# Patient Record
Sex: Male | Born: 1965
Health system: Southern US, Community
[De-identification: ages and names within clinical notes are randomized; demographics above are authoritative.]

## PROBLEM LIST (undated history)

## (undated) DIAGNOSIS — I10 Essential (primary) hypertension: Secondary | ICD-10-CM

## (undated) DIAGNOSIS — I639 Cerebral infarction, unspecified: Secondary | ICD-10-CM

## (undated) DIAGNOSIS — E7800 Pure hypercholesterolemia, unspecified: Secondary | ICD-10-CM

## (undated) HISTORY — PX: NO PAST SURGERIES: SHX2092

## (undated) HISTORY — DX: Pure hypercholesterolemia, unspecified: E78.00

---

## 2008-12-21 ENCOUNTER — Encounter: Admission: RE | Admit: 2008-12-21 | Discharge: 2008-12-21 | Payer: Self-pay | Admitting: Orthopedic Surgery

## 2008-12-26 ENCOUNTER — Encounter: Admission: RE | Admit: 2008-12-26 | Discharge: 2008-12-26 | Payer: Self-pay | Admitting: Surgery

## 2008-12-31 ENCOUNTER — Encounter: Admission: RE | Admit: 2008-12-31 | Discharge: 2008-12-31 | Payer: Self-pay | Admitting: Surgery

## 2009-01-07 ENCOUNTER — Encounter: Admission: RE | Admit: 2009-01-07 | Discharge: 2009-01-07 | Payer: Self-pay | Admitting: Surgery

## 2010-01-20 ENCOUNTER — Encounter: Admission: RE | Admit: 2010-01-20 | Discharge: 2010-01-20 | Payer: Self-pay | Admitting: Orthopedic Surgery

## 2010-05-10 ENCOUNTER — Encounter: Payer: Self-pay | Admitting: Orthopedic Surgery

## 2010-05-11 ENCOUNTER — Encounter: Payer: Self-pay | Admitting: Orthopedic Surgery

## 2011-05-02 ENCOUNTER — Other Ambulatory Visit: Payer: Self-pay

## 2011-05-02 ENCOUNTER — Encounter (HOSPITAL_COMMUNITY): Payer: Self-pay

## 2011-05-02 ENCOUNTER — Emergency Department (HOSPITAL_COMMUNITY): Payer: 59

## 2011-05-02 ENCOUNTER — Inpatient Hospital Stay (HOSPITAL_COMMUNITY)
Admission: EM | Admit: 2011-05-02 | Discharge: 2011-05-04 | DRG: 065 | Disposition: A | Discharge status: Home / Self Care | Payer: 59 | Attending: Internal Medicine | Admitting: Internal Medicine

## 2011-05-02 DIAGNOSIS — E785 Hyperlipidemia, unspecified: Secondary | ICD-10-CM | POA: Diagnosis present

## 2011-05-02 DIAGNOSIS — E119 Type 2 diabetes mellitus without complications: Secondary | ICD-10-CM | POA: Diagnosis present

## 2011-05-02 DIAGNOSIS — R29810 Facial weakness: Secondary | ICD-10-CM | POA: Diagnosis present

## 2011-05-02 DIAGNOSIS — I639 Cerebral infarction, unspecified: Secondary | ICD-10-CM | POA: Diagnosis present

## 2011-05-02 DIAGNOSIS — G8190 Hemiplegia, unspecified affecting unspecified side: Secondary | ICD-10-CM | POA: Diagnosis present

## 2011-05-02 DIAGNOSIS — R5383 Other fatigue: Secondary | ICD-10-CM | POA: Diagnosis present

## 2011-05-02 DIAGNOSIS — I6350 Cerebral infarction due to unspecified occlusion or stenosis of unspecified cerebral artery: Principal | ICD-10-CM | POA: Diagnosis present

## 2011-05-02 DIAGNOSIS — Z7982 Long term (current) use of aspirin: Secondary | ICD-10-CM

## 2011-05-02 DIAGNOSIS — E871 Hypo-osmolality and hyponatremia: Secondary | ICD-10-CM | POA: Diagnosis present

## 2011-05-02 DIAGNOSIS — G8194 Hemiplegia, unspecified affecting left nondominant side: Secondary | ICD-10-CM | POA: Diagnosis present

## 2011-05-02 DIAGNOSIS — IMO0001 Reserved for inherently not codable concepts without codable children: Secondary | ICD-10-CM | POA: Diagnosis present

## 2011-05-02 DIAGNOSIS — R4789 Other speech disturbances: Secondary | ICD-10-CM | POA: Diagnosis present

## 2011-05-02 DIAGNOSIS — R5381 Other malaise: Secondary | ICD-10-CM | POA: Diagnosis present

## 2011-05-02 DIAGNOSIS — I10 Essential (primary) hypertension: Secondary | ICD-10-CM | POA: Diagnosis present

## 2011-05-02 DIAGNOSIS — E7800 Pure hypercholesterolemia, unspecified: Secondary | ICD-10-CM | POA: Diagnosis present

## 2011-05-02 DIAGNOSIS — R279 Unspecified lack of coordination: Secondary | ICD-10-CM | POA: Diagnosis present

## 2011-05-02 HISTORY — DX: Essential (primary) hypertension: I10

## 2011-05-02 LAB — CBC
HCT: 36.4 % — ABNORMAL LOW (ref 39.0–52.0)
Hemoglobin: 13.2 g/dL (ref 13.0–17.0)
MCH: 31.2 pg (ref 26.0–34.0)
MCH: 30.8 pg (ref 26.0–34.0)
MCHC: 36.3 g/dL — ABNORMAL HIGH (ref 30.0–36.0)
MCV: 86.1 fL (ref 78.0–100.0)
Platelets: 235 10*3/uL (ref 150–400)
Platelets: 249 10*3/uL (ref 150–400)
RBC: 4.23 MIL/uL (ref 4.22–5.81)
RBC: 4.16 MIL/uL — ABNORMAL LOW (ref 4.22–5.81)
RDW: 12.1 % (ref 11.5–15.5)
WBC: 7.6 10*3/uL (ref 4.0–10.5)
WBC: 10.2 10*3/uL (ref 4.0–10.5)

## 2011-05-02 LAB — GLUCOSE, CAPILLARY: Glucose-Capillary: 122 mg/dL — ABNORMAL HIGH (ref 70–99)

## 2011-05-02 LAB — LIPID PANEL
Cholesterol: 269 mg/dL — ABNORMAL HIGH (ref 0–200)
LDL Cholesterol: 194 mg/dL — ABNORMAL HIGH (ref 0–99)
Total CHOL/HDL Ratio: 5.5 RATIO
VLDL: 26 mg/dL (ref 0–40)

## 2011-05-02 LAB — BASIC METABOLIC PANEL
BUN: 23 mg/dL (ref 6–23)
CO2: 24 mEq/L (ref 19–32)
Calcium: 9.4 mg/dL (ref 8.4–10.5)
Chloride: 95 mEq/L — ABNORMAL LOW (ref 96–112)
Creatinine, Ser: 0.88 mg/dL (ref 0.50–1.35)
GFR calc Af Amer: >90 mL/min (ref >90)
GFR calc non Af Amer: >90 mL/min (ref >90)
Glucose, Bld: 189 mg/dL — ABNORMAL HIGH (ref 70–99)
Potassium: 5 mEq/L (ref 3.5–5.1)
Sodium: 130 mEq/L — ABNORMAL LOW (ref 135–145)

## 2011-05-02 LAB — URINALYSIS, ROUTINE W REFLEX MICROSCOPIC
Bilirubin Urine: NEGATIVE
Glucose, UA: 250 mg/dL — AB
Hgb urine dipstick: NEGATIVE
Ketones, ur: 15 mg/dL — AB
Leukocytes, UA: NEGATIVE
Nitrite: NEGATIVE
Protein, ur: 30 mg/dL — AB
Specific Gravity, Urine: 1.016 (ref 1.005–1.030)
Urobilinogen, UA: 0.2 mg/dL (ref 0.0–1.0)
pH: 5 (ref 5.0–8.0)

## 2011-05-02 LAB — URINE MICROSCOPIC-ADD ON

## 2011-05-02 LAB — CREATININE, SERUM
Creatinine, Ser: 0.87 mg/dL (ref 0.50–1.35)
GFR calc non Af Amer: >90 mL/min (ref >90)

## 2011-05-02 MED ORDER — GADOBENATE DIMEGLUMINE 529 MG/ML IV SOLN
18.0000 mL | Freq: Once | INTRAVENOUS | Status: AC
  Administered 2011-05-02: 18 mL via INTRAVENOUS

## 2011-05-02 MED ORDER — SENNOSIDES-DOCUSATE SODIUM 8.6-50 MG PO TABS: 1.0000 | ORAL_TABLET | Freq: Every evening | ORAL | Status: DC | PRN

## 2011-05-02 MED ORDER — ENOXAPARIN SODIUM 40 MG/0.4ML ~~LOC~~ SOLN
40.0000 mg | SUBCUTANEOUS | Status: DC
  Administered 2011-05-02 – 2011-05-03 (×2): 40 mg via SUBCUTANEOUS
  Filled 2011-05-02 (×3): qty 0.4

## 2011-05-02 MED ORDER — ASPIRIN 325 MG PO TABS
325.0000 mg | ORAL_TABLET | Freq: Every day | ORAL | Status: DC
  Filled 2011-05-02 (×3): qty 1

## 2011-05-02 MED ORDER — INSULIN ASPART 100 UNIT/ML ~~LOC~~ SOLN
0.0000 [IU] | Freq: Three times a day (TID) | SUBCUTANEOUS | Status: DC
  Filled 2011-05-02: qty 3

## 2011-05-02 MED ORDER — ASPIRIN 300 MG RE SUPP: 300.0000 mg | Freq: Every day | RECTAL | Status: DC

## 2011-05-02 NOTE — ED Notes (Signed)
Family at bedside. 

## 2011-05-02 NOTE — ED Notes (Signed)
Pt states gradual weakness noted to left hand/ grasp and facial drooping. Pt states small amount of slurred speech on Thursday. States went to sleep for 17 hours and woke with worsening  Of symptoms.

## 2011-05-02 NOTE — ED Notes (Signed)
Pt given urinal but sts he cannot go at this time

## 2011-05-02 NOTE — ED Notes (Signed)
Patient denies pain and is resting comfortably.  

## 2011-05-02 NOTE — ED Notes (Signed)
Report given to [facility]. Pt transported to yellow zone. Pt aware of plan of care.

## 2011-05-02 NOTE — ED Provider Notes (Signed)
Medical screening examination/treatment/procedure(s) were conducted as a shared visit with non-physician practitioner(s) and myself.  I personally evaluated the patient during the encounter  Sx > 2 days, began while hiking. Worse yesterday. Denies tick bites. Lt facial droop, subtle LUE weakness. Concern for CVA on CT head. Admit for further w/u and evaluation. Out of window for tPA.  Forbes Cellar, MD 05/02/11 [phone]

## 2011-05-02 NOTE — ED Notes (Signed)
Vital signs stable. 

## 2011-05-02 NOTE — ED Provider Notes (Addendum)
History     CSN: [id number]  Arrival date & time 05/02/11  1029   First MD Initiated Contact with Patient 05/02/11 1046      Chief Complaint  Patient presents with  . Extremity Weakness    (Consider location/radiation/quality/duration/timing/severity/associated sxs/prior treatment) HPI Patient presents the emergency department with a three-day history of gradual onset of left arm weakness, facial numbness and drooping, difficulty speaking, and leg weakness.  Patient states the symptoms on Thursday or gradual in onset and when he woke up Friday they were profoundly worse.  He states that on Friday he drove home and went to bed and awoke this morning with same symptoms as he had on Friday.  Patient denies any headache visual changes, chest pain, shortness of breath, or nausea/vomiting.  Past Medical History  Diagnosis Date  . Hypertension   . Diabetes mellitus     History reviewed. No pertinent past surgical history.  History reviewed. No pertinent family history.  History  Substance Use Topics  . Smoking status: Former Smoker -- 1.0 packs/day    Quit date: 05/02/1999  . Smokeless tobacco: Not on file  . Alcohol Use: 4.8 oz/week    8 Cans of beer per week      Review of Systems ROS  All pertinent positives and negatives reviewed in the history of present illness   Allergies  Penicillins  Home Medications   Current Outpatient Rx  Name Route Sig Dispense Refill  . ASPIRIN 325 MG PO TABS Oral Take 650 mg by mouth daily as needed. For pain.    . ASPIRIN 81 MG PO TABS Oral Take 162 mg by mouth daily as needed. For pain.    Marland Kitchen LISINOPRIL 20 MG PO TABS Oral Take 20 mg by mouth daily.    Marland Kitchen METFORMIN HCL 500 MG PO TABS Oral Take 500 mg by mouth every evening.    Marland Kitchen METOPROLOL SUCCINATE ER 50 MG PO TB24 Oral Take 50 mg by mouth daily.    Marland Kitchen SITAGLIPTIN PHOSPHATE 100 MG PO TABS Oral Take 100 mg by mouth daily.      BP 176/101  Pulse 85  Temp(Src) 98.1 F (36.7 C) (Oral)   Resp 18  SpO2 99%  Physical Exam  Constitutional: He is oriented to person, place, and time. He appears well-developed and well-nourished. No distress.  HENT:  Head: Normocephalic and atraumatic.  Eyes: EOM are normal. Pupils are equal, round, and reactive to light.  Neck: Normal range of motion. Neck supple.  Cardiovascular: Normal rate, regular rhythm and normal heart sounds.   Pulmonary/Chest: Effort normal and breath sounds normal.  Neurological: He is alert and oriented to person, place, and time.       Patient has a left mouth drooping, weakness of grip on the left, and slurred speech.  Patient does not have much lower extremity weakness on exam.  Is alert and oriented x3.  He denies difficulty swallowing and handling his secretions.  Skin: Skin is dry. No rash noted.  Psychiatric: He has a normal mood and affect. His behavior is normal. Judgment and thought content normal.    ED Course  Procedures (including critical care time)  Labs Reviewed  CBC - Abnormal; Notable for the following:    HCT 36.4 (*)    MCHC 36.3 (*)    All other components within normal limits  BASIC METABOLIC PANEL - Abnormal; Notable for the following:    Sodium 130 (*)    Chloride 95 (*)  Glucose, Bld 189 (*)    All other components within normal limits  GLUCOSE, CAPILLARY - Abnormal; Notable for the following:    Glucose-Capillary 184 (*)    All other components within normal limits  URINALYSIS, ROUTINE W REFLEX MICROSCOPIC   Ct Head Wo Contrast  05/02/2011  *RADIOLOGY REPORT*  Clinical Data: Left-sided weakness and left facial droop.  Symptoms since none yesterday.  History of hypertension and diabetes.  CT HEAD WITHOUT CONTRAST  Technique:  Contiguous axial images were obtained from the base of the skull through the vertex without contrast.  Comparison: None.  Findings: Bone windows demonstrate minimal ethmoid air cell mucosal thickening.  Partially aerated petrous apices.  Soft tissue windows  demonstrate age advanced carotid atherosclerosis.  No hemorrhage, hydrocephalus, mass lesion, intra- axial, or extra-axial fluid collection.  Vague hypoattenuation within the lateral aspect of the right basil ganglia, continuing into the coronar radiata.  Example images 13 - 17.  IMPRESSION: Vague hypoattenuation in the right lateral basal ganglia extending into the right corona radiata.  Although age indeterminate, given the clinical history,  subacute ischemia/infarct cannot be excluded.  Pre post contrast brain MR should be considered.  I personally called and discussed this report with Ebbie Ridge  at 1134 am on 05/02/2011.  Original Report Authenticated By: Consuello Bossier, M.D.     Have spoken with the neuro hospitalist Dr. Noel Christmas about the patient he agrees to follow the patient in consult.    MDM  CVA based on his CT scan findings along with his physical exam findings and history of present illness.   MDM Reviewed: nursing note and vitals Interpretation: labs, ECG, CT scan and MRI Consults: admitting MD and neurology     2:02 PM  I spoke with the [facility] hospitalist who began treatment the patient in consultation with the neurologist.    Date: 05/02/2011  Rate: 77  Rhythm: normal sinus rhythm  QRS Axis: normal  Intervals: normal  ST/T Wave abnormalities: normal  Conduction Disutrbances:none  Narrative Interpretation:   Old EKG Reviewed: none available    Carlyle Dolly, PA-C 05/02/11 1317  Carlyle Dolly, PA-C 05/02/11 1403  Carlyle Dolly, PA-C 05/02/11 1604

## 2011-05-02 NOTE — ED Notes (Signed)
1215 vitals incorrect reading.

## 2011-05-02 NOTE — Consult Note (Signed)
[facility] Consult Note  Date: 05/02/2011  Patient name: Joe Boyle Medical record number: [id number] Date of birth: [birth year] Age: 46 y.o. Gender: male   Chief Complaint:Stroke  History of Present Illness: Joe Boyle is an 46 y.o. Male with 13 yr h/o DM and HTN suboptimally controlled.  Has high cholesterol, but intolerant to lipitor and is trying diet modification.    Presented to ER with >24 hrs progressive slurred speech and weakness of the L hand/arm.  Thrusday evening noticed difficulty carrying wood.  Friday had more difficulty with control of L hand, especially for fine motor activities and had slight slurred speech.  Felt tired.  Woke this am with increased LUE weakness, L facial droop and slurred speech.    MRI confirmed acute right basal ganglia and adjacent white matter lacunar infarct.  No mass effect or hemorrhage.  Neurology consult requested to assist with stroke management.   Meds: Prior to Admission medications   Medication Sig Start Date End Date Taking? Authorizing Provider  aspirin 325 MG tablet Take 650 mg by mouth daily as needed. For pain.   Yes Historical Provider, MD  aspirin 81 MG tablet Take 162 mg by mouth daily as needed. For pain.   Yes Historical Provider, MD  lisinopril (PRINIVIL,ZESTRIL) 20 MG tablet Take 20 mg by mouth daily.   Yes Historical Provider, MD  metFORMIN (GLUCOPHAGE) 500 MG tablet Take 500 mg by mouth every evening.   Yes Historical Provider, MD  metoprolol succinate (TOPROL-XL) 50 MG 24 hr tablet Take 50 mg by mouth daily.   Yes Historical Provider, MD  sitaGLIPtin (JANUVIA) 100 MG tablet Take 100 mg by mouth daily.   Yes Historical Provider, MD     Allergies: Penicillins Intolerant to lipitor- myalgia.  Past Medical History  Diagnosis Date  . Hypertension   . Diabetes mellitus    History reviewed. No pertinent past surgical history.  History reviewed. No pertinent family history.  Social History: Married.  Works with  computers/ IT.  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   Quit smoking about 12 years ago. Drinks 3-4 beers daily and 6-8 on weekends.  Denies IDU.  Review of Systems: denies fever, chills, weight changes or loss of appetite.  No difficulty swallowing.  Does find himself drooling today.  Denies headache, dizziness, blurred VA or diploplia.  Occasional palpitation.  Denies CP or SOB.   No falls.  Has mild diabetic neuropathy LE's.   Examination:  Blood pressure 128/78, pulse 73, temperature 98.5 F Oral, resp. rate 17, SpO2 96.00%.  In general, pleasant and cooperative.  Attended by wife.  Cardiovascular: The patient has a regular rate and rhythm without murmer and no carotid bruits. Distal pulses are intact.  Mental status:   The patient is oriented to person, place and time. Recent and remote memory are intact. Attention span and concentration are normal. Language including repetition, naming, following commands are intact. Fund of knowledge of current and historical events, as well as vocabulary are normal. Mild dysarthria.  No aphasia.  Cranial Nerves: Pupils are equally round and reactive to light. Visual fields full to confrontation. Extraocular movements are intact without nystagmus. L lower facial droop.  Smile asymmetric, but pt able to correct with effort.  Hearing intact to bilateral finger rub. Tongue protrusion, uvula, palate midline.  Shoulder shrug intact  Motor: -5/5 LUE with weaker L grip than R. + Pronator drift LUE. Strength 5/5 x RUE/ bilateral LEs.   Reflexes:   Biceps  Triceps Brachioradialis  Knee Ankle  Right 2+  2+  2+   2+ 2+  Left  2+  2+  2+   2+ 2+  Plantars: down going bilaterally.  Coordination: dysmetria L upper extremity on finger to nose.  RAM impaired on the L.   R UE and bilat LE intact. Sensation: intact to light touch throughout.  Discrimination intact.   Labs: CBC     Status: Abnormal   05/02/11 10:52 AM   WBC 7.6  4.0 - 10.5 (K/uL)    RBC 4.23  4.22 - 5.81  (MIL/uL)    Hemoglobin 13.2  13.0 - 17.0 (g/dL)    HCT 40.9 (*) 81.1 - 52.0 (%)    MCV 86.1  78.0 - 100.0 (fL)    MCH 31.2  26.0 - 34.0 (pg)    MCHC 36.3 (*) 30.0 - 36.0 (g/dL)    RDW 91.4  78.2 - 95.6 (%)    Platelets 235  150 - 400 (K/uL)   BASIC METABOLIC PANEL     Status: Abnormal   05/02/11 10:52 AM   Sodium 130 (*) 135 - 145 (mEq/L)    Potassium 5.0  3.5 - 5.1 (mEq/L)    Chloride 95 (*) 96 - 112 (mEq/L)    CO2 24  19 - 32 (mEq/L)    Glucose, Bld 189 (*) 70 - 99 (mg/dL)    BUN 23  6 - 23 (mg/dL)    Creatinine, Ser 2.13  0.50 - 1.35 (mg/dL)    Calcium 9.4  8.4 - 10.5 (mg/dL)    GFR calc non Af Amer >90  >90 (mL/min)    GFR calc Af Amer >90  >90 (mL/min)   GLUCOSE, CAPILLARY     Status: Abnormal   05/02/11 11:26 AM   Glucose-Capillary 184 (*) 70 - 99 (mg/dL)   URINALYSIS, ROUTINE W REFLEX MICROSCOPIC     Status: Abnormal   05/02/11  2:26 PM   Color, Urine YELLOW  YELLOW     APPearance CLEAR  CLEAR     Specific Gravity, Urine 1.016  1.005 - 1.030     pH 5.0  5.0 - 8.0     Glucose, UA 250 (*) NEGATIVE (mg/dL)    Hgb urine dipstick NEGATIVE  NEGATIVE     Bilirubin Urine NEGATIVE  NEGATIVE     Ketones, ur 15 (*) NEGATIVE (mg/dL)    Protein, ur 30 (*) NEGATIVE (mg/dL)    Urobilinogen, UA 0.2  0.0 - 1.0 (mg/dL)    Nitrite NEGATIVE  NEGATIVE     Leukocytes, UA NEGATIVE  NEGATIVE    URINE MICROSCOPIC-ADD ON     Status: Abnormal   05/02/11  2:26 PM   Squamous Epithelial / LPF FEW (*) RARE     Casts HYALINE CASTS (*) NEGATIVE       Imaging: 05/02/2011    CT HEAD WITHOUT CONTRAST  Findings: Bone windows demonstrate minimal ethmoid air cell mucosal thickening.  Partially aerated petrous apices.  Soft tissue windows demonstrate age advanced carotid atherosclerosis.  No hemorrhage, hydrocephalus, mass lesion, intra- axial, or extra-axial fluid collection.  Vague hypoattenuation within the lateral aspect of the right basil ganglia, continuing into the coronar radiata.  Example images 13 -  17.  IMPRESSION: Vague hypoattenuation in the right lateral basal ganglia extending into the right corona radiata.  Although age indeterminate, given the clinical history,  subacute ischemia/infarct cannot be excluded.  Pre post contrast brain MR should be considered. Consuello Bossier, M.D.   05/02/2011  MRI HEAD WITHOUT AND WITH CONTRAST   Findings:  Confluent 13 mm focus of restricted diffusion tracks through the posterior right side white matter capsules to the right lentiform nuclei.  Associated T2 and FLAIR hyperintensity.  No mass effect or hemorrhage.  Diffusion signal elsewhere is within normal limits.  Major intracranial vascular flow voids are preserved. No midline shift, ventriculomegaly, mass effect, evidence of mass lesion, extra-axial collection or acute intracranial hemorrhage. Cervicomedullary junction and pituitary are within normal limits. Scattered mostly subcortical T2 and FLAIR hyperintensity in the cerebral white matter outside of the acute area.  Brainstem and cerebellum are within normal limits.  Negative visualized cervical spine. Visualized bone marrow signal is within normal limits.  No abnormal enhancement identified.  Mild paranasal sinus mucosal thickening.  Mastoids are clear. Visualized orbit soft tissues are within normal limits.  Negative visualized scalp soft tissues.  IMPRESSION: 1.  Acute right basal ganglia and adjacent white matter lacunar infarct.  No mass effect or hemorrhage. 2.  MRA findings are below.    05/02/11 MRA HEAD WITHOUT CONTRAST   Findings:  Antegrade flow in the posterior circulation with dominant distal left vertebral artery.  Normal PICA vessels. Normal vertebrobasilar junction.  Mild basilar artery irregularity, no basilar stenosis.  Superior cerebellar artery origins and PCA origins are within normal limits.  Bilateral PCA branches are within normal limits.  Posterior communicating arteries are diminutive or absent.  Antegrade flow in both ICA siphons.   Tortuous left cavernous ICA. No ICA stenosis.  Ophthalmic artery origins are within normal limits.  Normal carotid termini.  Normal MCA and ACA origins. Diminutive anterior communicating artery.  Visualized ACA branches are within normal limits.  Visualized left MCA branches are within normal limits.  Right MCA M1 segment is normal.  Small infundibulum at the right anterior temporal artery origin incidentally noted.  Visualized right MCA branches are within normal limits.  IMPRESSION: Minimal to mild intracranial atherosclerosis.  No intracranial stenosis or major branch occlusion.  H.LEE HALL III, M.D.     Assessment Mr. Joe Boyle is a 46 y.o. male with  1. Acute right basal ganglia and adjacent white matter lacunar infarct. No mass effect or hemorrhage.   Dysarthria, L mild hemiparesis and dysmetria.  Suspect ischemic in nature.  MRA showed no   major vessel occlusion.  Had lengthy discussion with patient and wife about importance of   risk factor reduction and adherence to therapy recommendations to improve chances for   best outcome.  They verbalized understanding.  Not tPA candidate as he presented > 24    hours beyond window.    2. DM- moderate control per pt.  3. HTN- labile per pt report.   Controlled presently.  4. Hyperlipidemia- intol to Lipitor.   Recommendations:  1. HgbA1c, fasting lipid panel, hypercoag panel (Stroke < 20 yo) 2. PT consult, OT consult, Speech consult 3. Echocardiogram 4. Carotid dopplers 6. Prophylactic therapy-Antiplatelet med: Aspirin -  325mg  7. Risk factor modification 8. Telemetry monitoring    LOS: 0 days   Marya Fossa PA-C [facility] [phone] 05/02/2011  3:40 PM

## 2011-05-02 NOTE — ED Notes (Signed)
Patient returned from MRI, waiting for results, still unable to provide a urine sample and will try again soon.  Provided coke to family member.

## 2011-05-02 NOTE — ED Notes (Signed)
Neurologist at the bedside to evaluate pt.

## 2011-05-02 NOTE — ED Notes (Signed)
Attempted to call report, RN unable to take and will return call. Phone number left.

## 2011-05-02 NOTE — Progress Notes (Signed)
Patient arrived to floor per stretcher with wife at bedside.  Patient is alert and oriented times three.  Settled into bed and placed on telemetry.

## 2011-05-02 NOTE — ED Notes (Signed)
Patient is resting comfortably. 

## 2011-05-02 NOTE — ED Notes (Signed)
PA at the bedside to assess pt. No distress noted at this time. Informed provider that pt passed stroke swallow screen.

## 2011-05-02 NOTE — ED Notes (Signed)
Patient transported to CT 

## 2011-05-02 NOTE — ED Notes (Signed)
Pt return from ct scan. Pt remains alert x3. No increase in symptoms noted

## 2011-05-02 NOTE — Plan of Care (Signed)
Problem: Phase I Progression Outcomes Goal: Pt admitted to Stroke Unit Outcome: Completed/Met Date Met:  05/02/11 Admitted to Unit 3000 Goal: Stroke Team notified of admission Outcome: Progressing Received stroke order set. Goal: Strict NPO til swallow screen done Outcome: Progressing Patient passed stroke swallow study and diet inputted. Goal: NIH Stroke Scale-NIHSS done on admission Outcome: Progressing Patient NIH documented on admission. Goal: Antithrombotic given by end of Day 2 Outcome: Completed/Met Date Met:  05/02/11 Patient given first dose of Lovenox. Goal: Bedrest with HOB 0-30 degrees Outcome: Progressing Patient is keeping HOB at 30 degrees.

## 2011-05-02 NOTE — H&P (Signed)
PCP:    Chief Complaint:  Left arm weakness  HPI: 46 y/o male who came to the hospital with chief complaint of weakness of the left arm and facial drooping on the left the face for past 2 days. Patient says that he has been on the mom tends over the past 2 days lady was staying at the care in and on Thursday night he noted that he had left arm weakness when he could not lift the chopped  [place]. Also noted facial weakness though he did not have slurred speech did not pass out. Patient was able to walk for 2 miles after the onset of weakness. He denies any chest pain denies any shortness of breath no fever or nausea vomiting or diarrhea no abdominal pain no dysuria urgency frequency of urination  Allergies:   Allergies  Allergen Reactions  . Penicillins Other (See Comments)    From when younger.      Past Medical History  Diagnosis Date  . Hypertension   . Diabetes mellitus     History reviewed. No pertinent past surgical history.  Prior to Admission medications   Medication Sig Start Date End Date Taking? Authorizing Provider  aspirin 325 MG tablet Take 650 mg by mouth daily as needed. For pain.   Yes Historical Provider, MD  aspirin 81 MG tablet Take 162 mg by mouth daily as needed. For pain.   Yes Historical Provider, MD  lisinopril (PRINIVIL,ZESTRIL) 20 MG tablet Take 20 mg by mouth daily.   Yes Historical Provider, MD  metFORMIN (GLUCOPHAGE) 500 MG tablet Take 500 mg by mouth every evening.   Yes Historical Provider, MD  metoprolol succinate (TOPROL-XL) 50 MG 24 hr tablet Take 50 mg by mouth daily.   Yes Historical Provider, MD  sitaGLIPtin (JANUVIA) 100 MG tablet Take 100 mg by mouth daily.   Yes Historical Provider, MD    Social History:  reports that he quit smoking about 12 years ago. He does not have any smokeless tobacco history on file. He reports that he drinks 3-6 beer every night. He reports that he does not use illicit drugs.   Review of Systems:    Constitutional: Denies fever, chills, diaphoresis, appetite change and fatigue.  HEENT: Denies photophobia, eye pain, redness, hearing loss, ear pain, congestion, sore throat, rhinorrhea, sneezing, mouth sores, trouble swallowing,   Respiratory: Denies SOB, DOE, cough, chest tightness,  and wheezing.   Cardiovascular: Denies chest pain, palpitations and leg swelling.  Gastrointestinal: Denies nausea, vomiting, abdominal pain, diarrhea, constipation, blood in stool and abdominal distention.  Genitourinary: Denies dysuria, urgency, frequency, hematuria, flank pain and difficulty urinating.  Neurological: Denies dizziness, seizures, syncope, weakness, light-headedness, numbness and headaches.     Physical Exam: Blood pressure 128/78, pulse 73, temperature 98.5 F (36.9 C), temperature source Oral, resp. rate 17, SpO2 96.00%. Constitutional: Vital signs reviewed.  Patient is a well-developed and well-nourished male  in no acute distress and cooperative with exam.  Alert and oriented x3.  Head: Normocephalic and atraumatic Mouth: no erythema or exudates, MMM Eyes: PERRL, EOMI, conjunctivae normal, No scleral icterus.  Neck: Supple, Trachea midline normal ROM, No JVD, mass, thyromegaly,  Cardiovascular: RRR, S1 normal, S2 normal, no MRG, pulses symmetric and intact bilaterally Pulmonary/Chest: CTAB, no wheezes, rales, or rhonchi Abdominal: Soft. Non-tender, non-distended, bowel sounds are normal, no masses, organomegaly, or guarding present. Neurological: A&O x3, Motor 4/5 in left upper extremity, left side facial droop, 5/5 in both lower extremities,  , sensory intact to  light touch bilaterally.  Skin: Warm, dry and intact. No rash, cyanosis, or clubbing.     Labs on Admission:  Results for orders placed during the hospital encounter of 05/02/11 (from the past 48 hour(s))  CBC     Status: Abnormal   Collection Time   05/02/11 10:52 AM      Component Value Range Comment   WBC 7.6  4.0 -  10.5 (K/uL)    RBC 4.23  4.22 - 5.81 (MIL/uL)    Hemoglobin 13.2  13.0 - 17.0 (g/dL)    HCT 16.1 (*) 09.6 - 52.0 (%)    MCV 86.1  78.0 - 100.0 (fL)    MCH 31.2  26.0 - 34.0 (pg)    MCHC 36.3 (*) 30.0 - 36.0 (g/dL)    RDW 04.5  40.9 - 81.1 (%)    Platelets 235  150 - 400 (K/uL)   BASIC METABOLIC PANEL     Status: Abnormal   Collection Time   05/02/11 10:52 AM      Component Value Range Comment   Sodium 130 (*) 135 - 145 (mEq/L)    Potassium 5.0  3.5 - 5.1 (mEq/L)    Chloride 95 (*) 96 - 112 (mEq/L)    CO2 24  19 - 32 (mEq/L)    Glucose, Bld 189 (*) 70 - 99 (mg/dL)    BUN 23  6 - 23 (mg/dL)    Creatinine, Ser 9.14  0.50 - 1.35 (mg/dL)    Calcium 9.4  8.4 - 10.5 (mg/dL)    GFR calc non Af Amer >90  >90 (mL/min)    GFR calc Af Amer >90  >90 (mL/min)   GLUCOSE, CAPILLARY     Status: Abnormal   Collection Time   05/02/11 11:26 AM      Component Value Range Comment   Glucose-Capillary 184 (*) 70 - 99 (mg/dL)    Comment 1 Documented in Chart      Comment 2 Notify RN     URINALYSIS, ROUTINE W REFLEX MICROSCOPIC     Status: Abnormal   Collection Time   05/02/11  2:26 PM      Component Value Range Comment   Color, Urine YELLOW  YELLOW     APPearance CLEAR  CLEAR     Specific Gravity, Urine 1.016  1.005 - 1.030     pH 5.0  5.0 - 8.0     Glucose, UA 250 (*) NEGATIVE (mg/dL)    Hgb urine dipstick NEGATIVE  NEGATIVE     Bilirubin Urine NEGATIVE  NEGATIVE     Ketones, ur 15 (*) NEGATIVE (mg/dL)    Protein, ur 30 (*) NEGATIVE (mg/dL)    Urobilinogen, UA 0.2  0.0 - 1.0 (mg/dL)    Nitrite NEGATIVE  NEGATIVE     Leukocytes, UA NEGATIVE  NEGATIVE    URINE MICROSCOPIC-ADD ON     Status: Abnormal   Collection Time   05/02/11  2:26 PM      Component Value Range Comment   Squamous Epithelial / LPF FEW (*) RARE     Casts HYALINE CASTS (*) NEGATIVE      Radiological Exams on Admission: Ct Head Wo Contrast  05/02/2011  *RADIOLOGY REPORT*  Clinical Data: Left-sided weakness and left facial  droop.  Symptoms since none yesterday.  History of hypertension and diabetes.  CT HEAD WITHOUT CONTRAST  Technique:  Contiguous axial images were obtained from the base of the skull through the vertex without contrast.  Comparison: None.  Findings: Bone windows demonstrate minimal ethmoid air cell mucosal thickening.  Partially aerated petrous apices.  Soft tissue windows demonstrate age advanced carotid atherosclerosis.  No hemorrhage, hydrocephalus, mass lesion, intra- axial, or extra-axial fluid collection.  Vague hypoattenuation within the lateral aspect of the right basil ganglia, continuing into the coronar radiata.  Example images 13 - 17.  IMPRESSION: Vague hypoattenuation in the right lateral basal ganglia extending into the right corona radiata.  Although age indeterminate, given the clinical history,  subacute ischemia/infarct cannot be excluded.  Pre post contrast brain MR should be considered.  I personally called and discussed this report with Ebbie Ridge  at 1134 am on 05/02/2011.  Original Report Authenticated By: Consuello Bossier, M.D.   Mr [name] Head Wo Contrast  05/02/2011  *RADIOLOGY REPORT*  Clinical Data:  45 year old male with left side weakness, left facial droop.  History of diabetes.  MRI HEAD WITHOUT AND WITH CONTRAST  Technique: Multiplanar, multiecho pulse sequences of the brain and surrounding structures were obtained according to standard protocol without and with intravenous contrast.  Contrast: 18mL MULTIHANCE GADOBENATE DIMEGLUMINE 529 MG/ML IV SOLN  Comparison: Head CT without contrast 05/02/2011. [facility] Cervical MRI 04/07/2010.  Findings:  Confluent 13 mm focus of restricted diffusion tracks through the posterior right side white matter capsules to the right lentiform nuclei.  Associated T2 and FLAIR hyperintensity.  No mass effect or hemorrhage.  Diffusion signal elsewhere is within normal limits.  Major intracranial vascular flow voids are preserved.  No midline shift, ventriculomegaly, mass effect, evidence of mass lesion, extra-axial collection or acute intracranial hemorrhage. Cervicomedullary junction and pituitary are within normal limits. Scattered mostly subcortical T2 and FLAIR hyperintensity in the cerebral white matter outside of the acute area.  Brainstem and cerebellum are within normal limits.  Negative visualized cervical spine. Visualized bone marrow signal is within normal limits.  No abnormal enhancement identified.  Mild paranasal sinus mucosal thickening.  Mastoids are clear. Visualized orbit soft tissues are within normal limits.  Negative visualized scalp soft tissues.  IMPRESSION: 1.  Acute right basal ganglia and adjacent white matter lacunar infarct.  No mass effect or hemorrhage. 2.  MRA findings are below.  MRA HEAD WITHOUT CONTRAST  Technique: Angiographic images of the Circle of Willis were obtained using MRA technique without  intravenous contrast.  Comparison: None.  Findings:  Antegrade flow in the posterior circulation with dominant distal left vertebral artery.  Normal PICA vessels. Normal vertebrobasilar junction.  Mild basilar artery irregularity, no basilar stenosis.  Superior cerebellar artery origins and PCA origins are within normal limits.  Bilateral PCA branches are within normal limits.  Posterior communicating arteries are diminutive or absent.  Antegrade flow in both ICA siphons.  Tortuous left cavernous ICA. No ICA stenosis.  Ophthalmic artery origins are within normal limits.  Normal carotid termini.  Normal MCA and ACA origins. Diminutive anterior communicating artery.  Visualized ACA branches are within normal limits.  Visualized left MCA branches are within normal limits.  Right MCA M1 segment is normal.  Small infundibulum at the right anterior temporal artery origin incidentally noted.  Visualized right MCA branches are within normal limits.  IMPRESSION: Minimal to mild intracranial atherosclerosis.  No  intracranial stenosis or major branch occlusion.  Original Report Authenticated By: Harley Hallmark, M.D.   Mr Laqueta Jean Wo Contrast  05/02/2011  *RADIOLOGY REPORT*  Clinical Data:  46 year old male with left side weakness, left facial droop.  History of diabetes.  MRI HEAD WITHOUT  AND WITH CONTRAST  Technique: Multiplanar, multiecho pulse sequences of the brain and surrounding structures were obtained according to standard protocol without and with intravenous contrast.  Contrast: 18mL MULTIHANCE GADOBENATE DIMEGLUMINE 529 MG/ML IV SOLN  Comparison: Head CT without contrast 05/02/2011. [facility] Cervical MRI 04/07/2010.  Findings:  Confluent 13 mm focus of restricted diffusion tracks through the posterior right side white matter capsules to the right lentiform nuclei.  Associated T2 and FLAIR hyperintensity.  No mass effect or hemorrhage.  Diffusion signal elsewhere is within normal limits.  Major intracranial vascular flow voids are preserved. No midline shift, ventriculomegaly, mass effect, evidence of mass lesion, extra-axial collection or acute intracranial hemorrhage. Cervicomedullary junction and pituitary are within normal limits. Scattered mostly subcortical T2 and FLAIR hyperintensity in the cerebral white matter outside of the acute area.  Brainstem and cerebellum are within normal limits.  Negative visualized cervical spine. Visualized bone marrow signal is within normal limits.  No abnormal enhancement identified.  Mild paranasal sinus mucosal thickening.  Mastoids are clear. Visualized orbit soft tissues are within normal limits.  Negative visualized scalp soft tissues.  IMPRESSION: 1.  Acute right basal ganglia and adjacent white matter lacunar infarct.  No mass effect or hemorrhage. 2.  MRA findings are below.  MRA HEAD WITHOUT CONTRAST  Technique: Angiographic images of the Circle of Willis were obtained using MRA technique without  intravenous contrast.  Comparison: None.   Findings:  Antegrade flow in the posterior circulation with dominant distal left vertebral artery.  Normal PICA vessels. Normal vertebrobasilar junction.  Mild basilar artery irregularity, no basilar stenosis.  Superior cerebellar artery origins and PCA origins are within normal limits.  Bilateral PCA branches are within normal limits.  Posterior communicating arteries are diminutive or absent.  Antegrade flow in both ICA siphons.  Tortuous left cavernous ICA. No ICA stenosis.  Ophthalmic artery origins are within normal limits.  Normal carotid termini.  Normal MCA and ACA origins. Diminutive anterior communicating artery.  Visualized ACA branches are within normal limits.  Visualized left MCA branches are within normal limits.  Right MCA M1 segment is normal.  Small infundibulum at the right anterior temporal artery origin incidentally noted.  Visualized right MCA branches are within normal limits.  IMPRESSION: Minimal to mild intracranial atherosclerosis.  No intracranial stenosis or major branch occlusion.  Original Report Authenticated By: Harley Hallmark, M.D.    Assessment/Plan Acute basal ganglia infarct MRA brain shows the patient has acute basal ganglia and adjacent white matter lacunar infarct. We'll start the patient on stroke protocol. Neurology consult Dr. Roseanne Reno has been obtained.  Hypertension At this time we'll hold the patient's antidepressant medications including lisinopril and metoprolol for permissive hypertension.  Diabetes mellitus We'll start sliding scale insulin. Will hold the Januvia and metformin at this time  DVT prophylaxis We'll start on Lovenox.   Time Spent on  Admission: 60 min  Cian Costanzo S [facility] Pager: [phone] 05/02/2011, 3:15 PM

## 2011-05-02 NOTE — ED Notes (Signed)
CBG 184  

## 2011-05-03 ENCOUNTER — Other Ambulatory Visit: Payer: Self-pay | Admitting: Neurology

## 2011-05-03 DIAGNOSIS — E785 Hyperlipidemia, unspecified: Secondary | ICD-10-CM | POA: Diagnosis present

## 2011-05-03 DIAGNOSIS — I517 Cardiomegaly: Secondary | ICD-10-CM

## 2011-05-03 LAB — COMPREHENSIVE METABOLIC PANEL
BUN: 26 mg/dL — ABNORMAL HIGH (ref 6–23)
CO2: 25 mEq/L (ref 19–32)
Chloride: 97 mEq/L (ref 96–112)
Creatinine, Ser: 0.9 mg/dL (ref 0.50–1.35)
GFR calc Af Amer: >90 mL/min (ref >90)
GFR calc non Af Amer: >90 mL/min (ref >90)
Total Bilirubin: 0.4 mg/dL (ref 0.3–1.2)

## 2011-05-03 LAB — GLUCOSE, CAPILLARY: Glucose-Capillary: 155 mg/dL — ABNORMAL HIGH (ref 70–99)

## 2011-05-03 LAB — HEMOGLOBIN A1C: Hgb A1c MFr Bld: 8.8 % — ABNORMAL HIGH (ref <5.7)

## 2011-05-03 NOTE — Progress Notes (Signed)
*  PRELIMINARY RESULTS*   Carotid Dopplers completed.  There is no significant ICA stenosis.  Vertebral artery flow is antegrade.    Sherren Kerns Renee 05/03/2011, 11:59 AM

## 2011-05-03 NOTE — Progress Notes (Signed)
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 He denies any worsening or improvement of those symptoms since admission. No chest pain or dyspnea.  Objective: Blood pressure 114/76, pulse 58, temperature 97.7 F (36.5 C), temperature source Oral, resp. rate 18, SpO2 93.00%.  Intake/Output Summary (Last 24 hours) at 05/03/11 0917 Last data filed at 05/03/11 0800  Gross per 24 hour  Intake    240 ml  Output      0 ml  Net    240 ml    General Exam: Comfortable. Ambulating with physical therapy. Respiratory System: Clear. No increased work of breathing. Cardiovascular System: First and second heart sounds heard. Regular rate and rhythm. No JVD/murmurs. Gastrointestinal System: Abdomen is non distended, soft and normal bowel sounds heard. Central Nervous System: Alert and oriented. Left facial weakness. Mild dysarthria. Extremities: Left upper extremity with grade 4 x 5 power. Rest of the limbs 5 x 5 power.  Lab Results: Basic Metabolic Panel:  Basename 05/02/11 1748 05/02/11 1052  NA -- 130*  K -- 5.0  CL -- 95*  CO2 -- 24  GLUCOSE -- 189*  BUN -- 23  CREATININE 0.87 0.88  CALCIUM -- 9.4  MG -- --  PHOS -- --   Liver Function Tests: No results found for this basename: AST:2,ALT:2,ALKPHOS:2,BILITOT:2,PROT:2,ALBUMIN:2 in the last 72 hours No results found for this basename: LIPASE:2,AMYLASE:2 in the last 72 hours No results found for this basename: AMMONIA:2 in the last 72 hours CBC:  Basename 05/02/11 1748 05/02/11 1052  WBC 10.2 7.6  NEUTROABS -- --  HGB 12.8* 13.2  HCT 35.7* 36.4*  MCV 85.8 86.1  PLT 249 235   Cardiac Enzymes: No results found for this basename: CKTOTAL:3,CKMB:3,CKMBINDEX:3,TROPONINI:3 in the last 72  hours BNP: No results found for this basename: PROBNP:3 in the last 72 hours D-Dimer: No results found for this basename: DDIMER:2 in the last 72 hours CBG:  Basename 05/03/11 0628 05/02/11 2324 05/02/11 1635 05/02/11 1126  GLUCAP 138* 122* 144* 184*   Hemoglobin A1C:  Basename 05/02/11 1748  HGBA1C 8.8*   Fasting Lipid Panel:  Basename 05/02/11 1749  CHOL 269*  HDL 49  LDLCALC 194*  TRIG 128  CHOLHDL 5.5  LDLDIRECT --    Studies/Results: Ct Head Wo Contrast  05/02/2011  *RADIOLOGY REPORT*  Clinical Data: Left-sided weakness and left facial droop.  Symptoms since none yesterday.  History of hypertension and diabetes.  CT HEAD WITHOUT CONTRAST  IMPRESSION: Vague hypoattenuation in the right lateral basal ganglia extending into the right corona radiata.  Although age indeterminate, given the clinical history,  subacute ischemia/infarct cannot be excluded.  Pre post contrast brain MR should be considered.  I personally called and discussed this report with Joe Boyle  at 1134 am on 05/02/2011.  Original Report Authenticated By: Consuello Bossier, M.D.   [name] Head Wo Contrast  05/02/2011  *RADIOLOGY REPORT*  Clinical Data:  46 year old male with left side weakness, left facial droop.  History of diabetes.  MRI HEAD WITHOUT AND WITH CONTRAST   IMPRESSION: 1.  Acute right basal ganglia and adjacent white matter lacunar infarct.  No mass effect or hemorrhage. 2.  MRA findings are below.  MRA HEAD WITHOUT CONTRAST  IMPRESSION: Minimal to mild intracranial atherosclerosis.  No intracranial stenosis or major branch  occlusion.  Original Report Authenticated By: Harley Hallmark, M.D.     Medications: Scheduled Meds:   . aspirin  325 mg Oral Daily  . enoxaparin  40 mg Subcutaneous Q24H  . gadobenate dimeglumine  18 mL Intravenous Once  . insulin aspart  0-9 Units Subcutaneous TID WC  . DISCONTD: aspirin  300 mg Rectal Daily   Continuous Infusions:  PRN  Meds:.senna-docusate  Assessment/Plan: 1. Acute right basal ganglia and adjacent white matter lacunar infarct, with dysarthria and left upper extremity weakness: Followup on 2-D echocardiogram and carotid Dopplers. Continue evaluation by physical therapy, occupational therapy and speech therapy. Patient has passed the swallow screen. Continue aspirin. Aim to control the secondary risk factors including diabetes, hypertension and dyslipidemia. 2. Uncontrolled type 2 diabetes mellitus: Patient indicates that his blood sugars usually range between 160-1 80 mg/dL at home. He is tried multiple medications over the last 2 years to his primary care physician. Obviously the blood sugar needs to be better controlled. For now continue sliding scale insulin and reasonable inpatient control. 3. Hypertension: Soft blood pressures today. Will hold antihypertensives and allow permissive hypertension secondary to recent ischemic stroke. Consider starting low dose of antihypertensives tomorrow. 4. Dyslipidemia: Patient had severe myalgia with statins/Lipitor and hence cannot be used. Aim to control diabetes and recheck his lipid panel. Exercise and weight loss. 5. Hyponatremia: Repeat BMP this morning. 6. ? Medication non-compliance 7. Alcohol use/??abuse. Check CIWA score.   Joe Boyle 05/03/2011, 9:17 AM

## 2011-05-03 NOTE — Progress Notes (Signed)
Patient ID: Joe Boyle, male   DOB: [birth year], 46 y.o.   MRN: [id number] Stroke Team Progress Note  HISTORY Mr. Engel is a 45y/o man with history of hypertension, hyperlipidemia intolerant to statins, and hyperglycemia who presented on 1/12 with gradual onset over 24 hours of left arm/hand weakness, left facial droop, and dysarthria. tPA was not given due to being outside the time window. MRI demonstrated an acute right basal ganglia infarct.  SUBJECTIVE Patient is sitting on the edge of his bed. Overall he feels his condition is gradually improving. He continues to report poor coordination of his left hand and mild left arm weakness. He is otherwise doing well. We discussed his comorbid health issues this morning. He reports he is intolerant to all statins due to muscle pain. He was placed on another lipid medication but says he really doesn't want to take that again. He also reports he has been tried on multiple meds for his DM previously and can't believe his A1c is as elevated as it is. He was told by his PCP to talk ASA 81mg  daily at home, but he has not been doing this regularly.  OBJECTIVE Most recent Vital Signs: Temp: 97.7 F (36.5 C) (01/13 0521) Temp src: Oral (01/13 0521) BP: 114/76 mmHg (01/13 0521) Pulse Rate: 58  (01/13 0521) Respiratory Rate: 18 O2 Saturation: 93%  CBG (last 3)  Basename 05/03/11 0628 05/02/11 2324 05/02/11 1635  GLUCAP 138* 122* 144*   Intake/Output from previous day:    IV Fluid Intake:    Medications    . aspirin  325 mg Oral Daily  . enoxaparin  40 mg Subcutaneous Q24H  . gadobenate dimeglumine  18 mL Intravenous Once  . insulin aspart  0-9 Units Subcutaneous TID WC  . DISCONTD: aspirin  300 mg Rectal Daily  PRN senna-docusate  Diet:  Cardiac thin liquids Activity:  Bathroom privileges. Up with assistance. DVT Prophylaxis:  Lovenox  Studies: CBC    Component Value Date/Time   WBC 10.2 05/02/2011 1748   RBC 4.16* 05/02/2011 1748   HGB 12.8*  05/02/2011 1748   HCT 35.7* 05/02/2011 1748   PLT 249 05/02/2011 1748   MCV 85.8 05/02/2011 1748   MCH 30.8 05/02/2011 1748   MCHC 35.9 05/02/2011 1748   RDW 12.2 05/02/2011 1748   CMP    Component Value Date/Time   NA 130* 05/02/2011 1052   K 5.0 05/02/2011 1052   CL 95* 05/02/2011 1052   CO2 24 05/02/2011 1052   GLUCOSE 189* 05/02/2011 1052   BUN 23 05/02/2011 1052   CREATININE 0.87 05/02/2011 1748   CALCIUM 9.4 05/02/2011 1052   GFRNONAA >90 05/02/2011 1748   GFRAA >90 05/02/2011 1748   COAGS No results found for this basename: INR, PROTIME   Lipid Panel    Component Value Date/Time   CHOL 269* 05/02/2011 1749   TRIG 128 05/02/2011 1749   HDL 49 05/02/2011 1749   CHOLHDL 5.5 05/02/2011 1749   VLDL 26 05/02/2011 1749   LDLCALC 194* 05/02/2011 1749   HgbA1C  Lab Results  Component Value Date   HGBA1C 8.8* 05/02/2011   Urine Drug Screen  No results found for this basename: labopia, cocainscrnur, labbenz, amphetmu, thcu, labbarb    Alcohol Level No results found for this basename: eth     Results for orders placed during the hospital encounter of 05/02/11 (from the past 24 hour(s))  CBC     Status: Abnormal   Collection Time   05/02/11  10:52 AM      Component Value Range   WBC 7.6  4.0 - 10.5 (K/uL)   RBC 4.23  4.22 - 5.81 (MIL/uL)   Hemoglobin 13.2  13.0 - 17.0 (g/dL)   HCT 16.1 (*) 09.6 - 52.0 (%)   MCV 86.1  78.0 - 100.0 (fL)   MCH 31.2  26.0 - 34.0 (pg)   MCHC 36.3 (*) 30.0 - 36.0 (g/dL)   RDW 04.5  40.9 - 81.1 (%)   Platelets 235  150 - 400 (K/uL)  BASIC METABOLIC PANEL     Status: Abnormal   Collection Time   05/02/11 10:52 AM      Component Value Range   Sodium 130 (*) 135 - 145 (mEq/L)   Potassium 5.0  3.5 - 5.1 (mEq/L)   Chloride 95 (*) 96 - 112 (mEq/L)   CO2 24  19 - 32 (mEq/L)   Glucose, Bld 189 (*) 70 - 99 (mg/dL)   BUN 23  6 - 23 (mg/dL)   Creatinine, Ser 9.14  0.50 - 1.35 (mg/dL)   Calcium 9.4  8.4 - 78.2 (mg/dL)   GFR calc non Af Amer >90  >90 (mL/min)    GFR calc Af Amer >90  >90 (mL/min)  GLUCOSE, CAPILLARY     Status: Abnormal   Collection Time   05/02/11 11:26 AM      Component Value Range   Glucose-Capillary 184 (*) 70 - 99 (mg/dL)   Comment 1 Documented in Chart     Comment 2 Notify RN    URINALYSIS, ROUTINE W REFLEX MICROSCOPIC     Status: Abnormal   Collection Time   05/02/11  2:26 PM      Component Value Range   Color, Urine YELLOW  YELLOW    APPearance CLEAR  CLEAR    Specific Gravity, Urine 1.016  1.005 - 1.030    pH 5.0  5.0 - 8.0    Glucose, UA 250 (*) NEGATIVE (mg/dL)   Hgb urine dipstick NEGATIVE  NEGATIVE    Bilirubin Urine NEGATIVE  NEGATIVE    Ketones, ur 15 (*) NEGATIVE (mg/dL)   Protein, ur 30 (*) NEGATIVE (mg/dL)   Urobilinogen, UA 0.2  0.0 - 1.0 (mg/dL)   Nitrite NEGATIVE  NEGATIVE    Leukocytes, UA NEGATIVE  NEGATIVE   URINE MICROSCOPIC-ADD ON     Status: Abnormal   Collection Time   05/02/11  2:26 PM      Component Value Range   Squamous Epithelial / LPF FEW (*) RARE    Casts HYALINE CASTS (*) NEGATIVE   GLUCOSE, CAPILLARY     Status: Abnormal   Collection Time   05/02/11  4:35 PM      Component Value Range   Glucose-Capillary 144 (*) 70 - 99 (mg/dL)  CBC     Status: Abnormal   Collection Time   05/02/11  5:48 PM      Component Value Range   WBC 10.2  4.0 - 10.5 (K/uL)   RBC 4.16 (*) 4.22 - 5.81 (MIL/uL)   Hemoglobin 12.8 (*) 13.0 - 17.0 (g/dL)   HCT 95.6 (*) 21.3 - 52.0 (%)   MCV 85.8  78.0 - 100.0 (fL)   MCH 30.8  26.0 - 34.0 (pg)   MCHC 35.9  30.0 - 36.0 (g/dL)   RDW 08.6  57.8 - 46.9 (%)   Platelets 249  150 - 400 (K/uL)  CREATININE, SERUM     Status: Normal   Collection Time  05/02/11  5:48 PM      Component Value Range   Creatinine, Ser 0.87  0.50 - 1.35 (mg/dL)   GFR calc non Af Amer >90  >90 (mL/min)   GFR calc Af Amer >90  >90 (mL/min)  HEMOGLOBIN A1C     Status: Abnormal   Collection Time   05/02/11  5:48 PM      Component Value Range   Hemoglobin A1C 8.8 (*) <5.7 (%)   Mean  Plasma Glucose 206 (*) <117 (mg/dL)  LIPID PANEL     Status: Abnormal   Collection Time   05/02/11  5:49 PM      Component Value Range   Cholesterol 269 (*) 0 - 200 (mg/dL)   Triglycerides 295  <621 (mg/dL)   HDL 49  >30 (mg/dL)   Total CHOL/HDL Ratio 5.5     VLDL 26  0 - 40 (mg/dL)   LDL Cholesterol 865 (*) 0 - 99 (mg/dL)  GLUCOSE, CAPILLARY     Status: Abnormal   Collection Time   05/02/11 11:24 PM      Component Value Range   Glucose-Capillary 122 (*) 70 - 99 (mg/dL)   Comment 1 Documented in Chart     Comment 2 Notify RN    GLUCOSE, CAPILLARY     Status: Abnormal   Collection Time   05/03/11  6:28 AM      Component Value Range   Glucose-Capillary 138 (*) 70 - 99 (mg/dL)   Comment 1 Documented in Chart     Comment 2 Notify RN    ANTITHROMBIN III     Status: Normal   Collection Time   05/03/11  6:50 AM      Component Value Range   AntiThromb III Func 99  75 - 120 (%)   CT of the brain  05/02/2011  *RADIOLOGY REPORT*  Clinical Data: Left-sided weakness and left facial droop.  Symptoms since none yesterday.  History of hypertension and diabetes.  CT HEAD WITHOUT CONTRAST  Technique:  Contiguous axial images were obtained from the base of the skull through the vertex without contrast.  Comparison: None.  Findings: Bone windows demonstrate minimal ethmoid air cell mucosal thickening.  Partially aerated petrous apices.  Soft tissue windows demonstrate age advanced carotid atherosclerosis.  No hemorrhage, hydrocephalus, mass lesion, intra- axial, or extra-axial fluid collection.  Vague hypoattenuation within the lateral aspect of the right basil ganglia, continuing into the coronar radiata.  Example images 13 - 17.  IMPRESSION: Vague hypoattenuation in the right lateral basal ganglia extending into the right corona radiata.  Although age indeterminate, given the clinical history,  subacute ischemia/infarct cannot be excluded.  Pre post contrast brain MR should be considered.  I personally  called and discussed this report with Ebbie Ridge  at 1134 am on 05/02/2011.  Original Report Authenticated By: Consuello Bossier, M.D.   MRI of the brain  05/02/2011  *RADIOLOGY REPORT*  Clinical Data:  46 year old male with left side weakness, left facial droop.  History of diabetes.  MRI HEAD WITHOUT AND WITH CONTRAST  Technique: Multiplanar, multiecho pulse sequences of the brain and surrounding structures were obtained according to standard protocol without and with intravenous contrast.  Contrast: 18mL MULTIHANCE GADOBENATE DIMEGLUMINE 529 MG/ML IV SOLN  Comparison: Head CT without contrast 05/02/2011. [facility] Cervical MRI 04/07/2010.  Findings:  Confluent 13 mm focus of restricted diffusion tracks through the posterior right side white matter capsules to the right lentiform nuclei.  Associated T2  and FLAIR hyperintensity.  No mass effect or hemorrhage.  Diffusion signal elsewhere is within normal limits.  Major intracranial vascular flow voids are preserved. No midline shift, ventriculomegaly, mass effect, evidence of mass lesion, extra-axial collection or acute intracranial hemorrhage. Cervicomedullary junction and pituitary are within normal limits. Scattered mostly subcortical T2 and FLAIR hyperintensity in the cerebral white matter outside of the acute area.  Brainstem and cerebellum are within normal limits.  Negative visualized cervical spine. Visualized bone marrow signal is within normal limits.  No abnormal enhancement identified.  Mild paranasal sinus mucosal thickening.  Mastoids are clear. Visualized orbit soft tissues are within normal limits.  Negative visualized scalp soft tissues.  IMPRESSION: 1.  Acute right basal ganglia and adjacent white matter lacunar infarct.  No mass effect or hemorrhage. 2.  MRA findings are below.  MRA HEAD WITHOUT CONTRAST  Technique: Angiographic images of the Circle of Willis were obtained using MRA technique without  intravenous  contrast.  Comparison: None.  Findings:  Antegrade flow in the posterior circulation with dominant distal left vertebral artery.  Normal PICA vessels. Normal vertebrobasilar junction.  Mild basilar artery irregularity, no basilar stenosis.  Superior cerebellar artery origins and PCA origins are within normal limits.  Bilateral PCA branches are within normal limits.  Posterior communicating arteries are diminutive or absent.  Antegrade flow in both ICA siphons.  Tortuous left cavernous ICA. No ICA stenosis.  Ophthalmic artery origins are within normal limits.  Normal carotid termini.  Normal MCA and ACA origins. Diminutive anterior communicating artery.  Visualized ACA branches are within normal limits.  Visualized left MCA branches are within normal limits.  Right MCA M1 segment is normal.  Small infundibulum at the right anterior temporal artery origin incidentally noted.  Visualized right MCA branches are within normal limits.  IMPRESSION: Minimal to mild intracranial atherosclerosis.  No intracranial stenosis or major branch occlusion.  Original Report Authenticated By: Harley Hallmark, M.D.   MRA of the brain  05/02/2011  *RADIOLOGY REPORT*  Clinical Data:  46 year old male with left side weakness, left facial droop.  History of diabetes.  MRI HEAD WITHOUT AND WITH CONTRAST  Technique: Multiplanar, multiecho pulse sequences of the brain and surrounding structures were obtained according to standard protocol without and with intravenous contrast.  Contrast: 18mL MULTIHANCE GADOBENATE DIMEGLUMINE 529 MG/ML IV SOLN  Comparison: Head CT without contrast 05/02/2011. [facility] Cervical MRI 04/07/2010.  Findings:  Confluent 13 mm focus of restricted diffusion tracks through the posterior right side white matter capsules to the right lentiform nuclei.  Associated T2 and FLAIR hyperintensity.  No mass effect or hemorrhage.  Diffusion signal elsewhere is within normal limits.  Major intracranial  vascular flow voids are preserved. No midline shift, ventriculomegaly, mass effect, evidence of mass lesion, extra-axial collection or acute intracranial hemorrhage. Cervicomedullary junction and pituitary are within normal limits. Scattered mostly subcortical T2 and FLAIR hyperintensity in the cerebral white matter outside of the acute area.  Brainstem and cerebellum are within normal limits.  Negative visualized cervical spine. Visualized bone marrow signal is within normal limits.  No abnormal enhancement identified.  Mild paranasal sinus mucosal thickening.  Mastoids are clear. Visualized orbit soft tissues are within normal limits.  Negative visualized scalp soft tissues.  IMPRESSION: 1.  Acute right basal ganglia and adjacent white matter lacunar infarct.  No mass effect or hemorrhage. 2.  MRA findings are below.  MRA HEAD WITHOUT CONTRAST  Technique: Angiographic images of the Circle of Willis were obtained  using MRA technique without  intravenous contrast.  Comparison: None.  Findings:  Antegrade flow in the posterior circulation with dominant distal left vertebral artery.  Normal PICA vessels. Normal vertebrobasilar junction.  Mild basilar artery irregularity, no basilar stenosis.  Superior cerebellar artery origins and PCA origins are within normal limits.  Bilateral PCA branches are within normal limits.  Posterior communicating arteries are diminutive or absent.  Antegrade flow in both ICA siphons.  Tortuous left cavernous ICA. No ICA stenosis.  Ophthalmic artery origins are within normal limits.  Normal carotid termini.  Normal MCA and ACA origins. Diminutive anterior communicating artery.  Visualized ACA branches are within normal limits.  Visualized left MCA branches are within normal limits.  Right MCA M1 segment is normal.  Small infundibulum at the right anterior temporal artery origin incidentally noted.  Visualized right MCA branches are within normal limits.  IMPRESSION: Minimal to mild  intracranial atherosclerosis.  No intracranial stenosis or major branch occlusion.  Original Report Authenticated By: Ulla Potash III, M.D.   EKG  normal EKG, normal sinus rhythm, unchanged from previous tracings.   Physical Exam   GENERAL:   Well nourished, well hydrated, no acute distress.   CARDIOVASCULAR:   Regular rate and rhythm, no thrills or palpable murmurs, S1, S2, no murmur, no rubs or gallops.      Carotid arteries: No carotid bruits.   RESPIRATORY:  Clear to auscultation bilaterally, no wheezes, rhonci or rales  EXTREMITIES:  No rashes or lesions     No peripheral edema, cyanosis, or clubbing   MENTAL STATUS EXAM:    Orientation:  Alert and oriented to person, place and time.      Memory:  Cooperative, follows commands well.  Recent and remote memory normal.      Attention, concentration:  Attention span and concentration are normal.      Language:  Speech is clear and language is normal.      Fund of knowledge:  Aware of current events, vocabulary appropriate for patient age.   CRANIAL NERVES:     CN 2 (Optic):  Visual fields intact to confrontation.      CN 3,4,6 (EOM):  Pupils equal and reactive to light and near full eye movement without nystagmus.      CN 5 (Trigeminal):  Facial sensation is normal, no weakness of masticatory muscles.      CN 7 (Facial):  No facial weakness or asymmetry.      CN 8 (Auditory):  Auditory acuity grossly normal.      CN 9,10 (Glossophar):  The uvula is midline, the palate elevates symmetrically.      CN 11 (spinal access):  Normal sternocleidomastoid and trapezius strength.      CN 12 (Hypoglossal):  The tongue is midline. No atrophy or fasciculations.   MOTOR:    Strength is 4/5 in the left wrist and hand, 4+/5 proximally. 5/5 in other extremities.  REFLEXES:     Triceps:                 (R): 2+  (L): 2+      Biceps:                  (R): 2+  (L): 2+      Brachioradialis:     (R): 2+  (L): 2+      Patellar:                 (R): 2+   (L): 2+  Achilles:                 (R): 2+  (L): 2+      Babinski:    (R): absent  (L): absent   COORDINATION:     Intact finger-to-nose, heel-to-shin. No tremor.   SENSATION:     Intact to light touch.   GAIT:     Routine gait intact.  ASSESSMENT Mr. Myrl Lazarus is a 46 y.o. male with a right basal ganglia infarct. On aspirin 325 mg orally every day for secondary stroke prevention.  Stroke risk factors:  diabetes mellitus, hyperlipidemia and hypertension  Hospital day # 1  TREATMENT/PLAN Right basal ganglia infarct: -Continue aspirin 325mg  daily for stroke prevention. -TTE -TCD/CUS -hypercoagulable workup pending -PT/OT/ST ordered. Suspect he will only require OT.  DM: -Currently on SSI, though he has been refusing doses. -Continue home Januvia. -Despite encouraging patient that he will require more aggressive glucose control, he is not interested in starting new medication at this time. He will follow up with his PCP on discharge for this.  Hyperlipidemia: -Reports that he is statin-intolerant. Despite encouraging him that he will need better lipid management, he is not interested in starting a new medication without consulting with his PCP. He will follow up with his PCP after discharge.  Diet: -Will change to a carb modified diet.  PPx: -Continue Lovenox for DVT ppx.  Kipp Laurence, MD [facility] Pager: [phone] 05/03/2011 10:02 AM

## 2011-05-03 NOTE — Progress Notes (Signed)
  Echocardiogram 2D Echocardiogram has been performed.  Redina Zeller, Real Cons 05/03/2011, 4:14 PM

## 2011-05-03 NOTE — Plan of Care (Signed)
Problem: Phase I Progression Outcomes Goal: Pain controlled with appropriate interventions Outcome: Progressing Patient had no complaints of discomfort upon arrival to floor.

## 2011-05-03 NOTE — Progress Notes (Signed)
Physical Therapy Evaluation Patient Details Name: Joe Boyle MRN: [id number] DOB: 1965/08/20 Today's Date: 05/03/2011  Problem List:  Patient Active Problem List  Diagnoses  . CVA (cerebral infarction)  . Hypertension  . Diabetes mellitus  . Hyperlipidemia    Past Medical History:  Past Medical History  Diagnosis Date  . Hypertension   . Diabetes mellitus    Past Surgical History: History reviewed. No pertinent past surgical history.  PT Assessment/Plan/Recommendation PT Assessment Clinical Impression Statement: Pt is a 46 y/o male who presents with a right basal ganglia CVA.  Pt is modified independent with all mobility.  No balance or functional mobility deficits noted.  PT signing off without follow-up needed. PT Recommendation/Assessment: Patent does not need any further PT services No Skilled PT: All education completed;Patient will have necessary level of assist by caregiver at discharge;Patient is modified independent with all activity/mobility PT Recommendation Follow Up Recommendations: No PT follow up Equipment Recommended: None recommended by PT  PT Evaluation Precautions/Restrictions  Precautions Required Braces or Orthoses: No Restrictions Weight Bearing Restrictions: No Prior Functioning  Home Living Lives With: Spouse;Daughter Type of Home: House Home Layout: Two level Alternate Level Stairs-Rails: Right Alternate Level Stairs-Number of Steps: 12 Home Access: Stairs to enter Entrance Stairs-Rails: Right;Left;Can reach both Entrance Stairs-Number of Steps: 3 Home Adaptive Equipment: None Prior Function Level of Independence: Independent with basic ADLs;Independent with homemaking with ambulation;Independent with gait;Independent with transfers Able to Take Stairs?: Reciprically Driving: Yes Vocation: Full time employment Cognition Cognition Arousal/Alertness: Awake/alert Overall Cognitive Status: Appears within functional limits for tasks  assessed Orientation Level: Oriented X4 Sensation/Coordination Sensation Light Touch: Appears Intact (Except for feet are hypersensitive PTA.) Stereognosis: Not tested Hot/Cold: Not tested Proprioception: Not tested Coordination Gross Motor Movements are Fluid and Coordinated: Yes Fine Motor Movements are Fluid and Coordinated: Not tested Extremity Assessment RUE Assessment RUE Assessment: Not tested LUE Assessment LUE Assessment: Not tested RLE Assessment RLE Assessment: Within Functional Limits LLE Assessment LLE Assessment: Within Functional Limits Pain 0/10 with evaluation. Mobility (including Balance) Bed Mobility Bed Mobility: Yes Supine to Sit: 7: Independent Transfers Transfers: Yes Sit to Stand: 7: Independent;From bed Stand to Sit: 7: Independent;To bed Ambulation/Gait Ambulation/Gait: Yes Ambulation/Gait Assistance: 6: Modified independent (Device/Increase time) Ambulation Distance (Feet): 250 Feet Assistive device: None Gait Pattern: Within Functional Limits Stairs: Yes Stairs Assistance: 6: Modified independent (Device/Increase time) Stair Management Technique: One rail Right;Forwards;Alternating pattern Number of Stairs: 2  Height of Stairs: 8  (8 inches.) Wheelchair Mobility Wheelchair Mobility: No  Posture/Postural Control Posture/Postural Control: No significant limitations Balance Balance Assessed: No Dynamic Gait Index Level Surface: Normal Change in Gait Speed: Mild Impairment Gait with Horizontal Head Turns: Normal Gait with Vertical Head Turns: Normal Gait and Pivot Turn: Normal Step Over Obstacle: Normal Step Around Obstacles: Normal Steps: Mild Impairment Total Score: 22  End of Session PT - End of Session Equipment Utilized During Treatment: Gait belt Activity Tolerance: Patient tolerated treatment well Patient left: in bed;with call bell in reach Nurse Communication: Mobility status for transfers;Mobility status for  ambulation General Behavior During Session: [facility] for tasks performed Cognition: [facility] for tasks performed  Cephus Shelling 05/03/2011, 11:23 AM  05/03/2011 Cephus Shelling, PT, DPT [phone]

## 2011-05-03 NOTE — ED Provider Notes (Signed)
Medical screening examination/treatment/procedure(s) were conducted as a shared visit with non-physician practitioner(s) and myself.  I personally evaluated the patient during the encounter   Lt facial droop, LUE mild dysmetria, grip strength 4/5. B/L LE 5/5. Minimally slurred speech. Onset >24 hours ago. CT with concern for CVA. Admit to hospitalist for further eval and w/u. Not a tPA candidate  Forbes Cellar, MD 05/03/11 [phone]

## 2011-05-04 DIAGNOSIS — G8194 Hemiplegia, unspecified affecting left nondominant side: Secondary | ICD-10-CM | POA: Diagnosis present

## 2011-05-04 LAB — FACTOR 5 LEIDEN

## 2011-05-04 LAB — GLUCOSE, CAPILLARY
Glucose-Capillary: 157 mg/dL — ABNORMAL HIGH (ref 70–99)
Glucose-Capillary: 142 mg/dL — ABNORMAL HIGH (ref 70–99)

## 2011-05-04 LAB — LUPUS ANTICOAGULANT PANEL
DRVVT: 40.6 secs (ref 34.1–42.2)
PTT Lupus Anticoagulant: 41 secs (ref 28.0–43.0)

## 2011-05-04 MED ORDER — ASPIRIN EC 325 MG PO TBEC: 325.0000 mg | DELAYED_RELEASE_TABLET | Freq: Every day | ORAL | Status: AC

## 2011-05-04 NOTE — Progress Notes (Signed)
Speech Language/Pathology Speech Language Pathology Evaluation Patient Details Name: Joe Boyle MRN: [id number] DOB: [birth year] Today's Date: 05/04/2011  GENERAL:  Pt. Is 46 yr old admitted with left arm weakness with an acute right basal ganglia infarct detected on MRI.  PMH:  DM, HTN.  Problem List:  Patient Active Problem List  Diagnoses  . CVA (cerebral infarction)  . Hypertension  . Diabetes mellitus  . Hyperlipidemia   Past Medical History:  Past Medical History  Diagnosis Date  . Hypertension   . Diabetes mellitus    Past Surgical History: History reviewed. No pertinent past surgical history.  SLP Assessment/Plan/Recommendation Assessment Clinical Impression Statement: Pt.'s converstional speech is intelligibile with occasional distortions and slightly decreased breath support for longer utterances.  Mild left labial/lingual weakness.  Pt.'s perception of minimal dysarthria is worse than it actually is. Pt. independently listed strategies he has been implementing to assist intelligibility and oral-motor function.  No deficits with pt.'s cognitive function.  No f/u  ST is needed, however, SLP will provide pt. with handout for oral-mototr exercises and review speech strategies for independent practice. 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[employer])  Cognition Overall Cognitive Status: Appears within functional limits for tasks assessed Arousal/Alertness: Awake/alert Orientation Level: Oriented  X4 Attention: Focused;Sustained;Selective Focused Attention: Appears intact Sustained Attention: Appears intact Selective Attention: Appears intact Memory: Appears intact Awareness: Appears intact Problem Solving: Appears intact Safety/Judgment: Appears intact  Comprehension Auditory Comprehension Overall Auditory Comprehension: Appears within functional limits for tasks assessed Commands: Within Functional Limits Visual Recognition/Discrimination Discrimination: Not tested Reading Comprehension Reading Status: Not tested  Expression Expression Primary Mode of Expression: Verbal Verbal Expression Overall Verbal Expression: Appears within functional limits for tasks assessed Initiation: No impairment Level of Generative/Spontaneous Verbalization: Conversation Pragmatics: No impairment Written Expression Written Expression: Not tested  Oral/Motor Oral Motor/Sensory Function Labial ROM: Reduced left Labial Symmetry: Abnormal symmetry left Labial Strength: Reduced Labial Sensation: Reduced Lingual ROM: Reduced left Lingual Symmetry: Abnormal symmetry left Lingual Strength: Within Functional Limits Lingual Sensation: Within Functional Limits Facial ROM: Reduced left Facial Symmetry: Left droop Facial Strength: Reduced Velum: Within Functional Limits Mandible: Within Functional Limits Motor Speech Overall Motor Speech: Appears within functional limits for tasks assessed Respiration: Impaired (MINIMALLY DECR. BREATH SUPPORT IN CONVERSATION) Level of Impairment: Conversation Phonation: Other (comment) (MINIMAL) Resonance: Within functional limits Articulation: Impaired Level of Impairment: Conversation (MINIMAL DISTORTION) Intelligibility: Intelligible Motor Planning: Witnin functional limits Motor Speech Errors: Not applicable  Royce Macadamia M.Ed [employer] [phone]  05/04/2011

## 2011-05-04 NOTE — Progress Notes (Signed)
Stroke Team Progress Note  HISTORY Joe Boyle is a 46y/o man with history of hypertension, hyperlipidemia intolerant to statins, and hyperglycemia who presented on 1/12 with gradual onset over 24 hours of left arm/hand weakness, left facial droop, and dysarthria. tPA was not given due to being outside the time window. MRI demonstrated an acute right basal ganglia infarct  SUBJECTIVE His wife is at the bedside. Overall he feels his condition is stable, though symptoms still remain.He still has mild left facial and significant left hand grip weakness.  OBJECTIVE Most recent Vital Signs: Temp: 97.9 F (36.6 C) (01/14 1000) Temp src: Oral (01/14 1000) BP: 114/81 mmHg (01/14 1000) Pulse Rate: 78  (01/14 1000) Respiratory Rate: 18 O2 Saturation: 98%  CBG (last 3)  Basename 05/04/11 0657 05/03/11 2300 05/03/11 1632  GLUCAP 157* 151* 155*   Intake/Output from previous day: 01/13 0701 - 01/14 0700 In: 1080 [P.O.:1080] Out: -   IV Fluid Intake:    Medications    . aspirin  325 mg Oral Daily  . enoxaparin  40 mg Subcutaneous Q24H  . insulin aspart  0-9 Units Subcutaneous TID WC  PRN senna-docusate  Diet:  Carb Control thin liquids Activity:   Up as tolerated DVT Prophylaxis:  Lovenox 40 mg sq daily   Studies: CBC    Component Value Date/Time   WBC 10.2 05/02/2011 1748   RBC 4.16* 05/02/2011 1748   HGB 12.8* 05/02/2011 1748   HCT 35.7* 05/02/2011 1748   PLT 249 05/02/2011 1748   MCV 85.8 05/02/2011 1748   MCH 30.8 05/02/2011 1748   MCHC 35.9 05/02/2011 1748   RDW 12.2 05/02/2011 1748   CMP    Component Value Date/Time   NA 132* 05/03/2011 0940   K 5.1 05/03/2011 0940   CL 97 05/03/2011 0940   CO2 25 05/03/2011 0940   GLUCOSE 175* 05/03/2011 0940   BUN 26* 05/03/2011 0940   CREATININE 0.90 05/03/2011 0940   CALCIUM 9.4 05/03/2011 0940   PROT 7.4 05/03/2011 0940   ALBUMIN 3.8 05/03/2011 0940   AST 17 05/03/2011 0940   ALT 28 05/03/2011 0940   ALKPHOS 65 05/03/2011 0940   BILITOT 0.4  05/03/2011 0940   GFRNONAA >90 05/03/2011 0940   GFRAA >90 05/03/2011 0940   COAGS No results found for this basename: INR, PROTIME   Lipid Panel    Component Value Date/Time   CHOL 269* 05/02/2011 1749   TRIG 128 05/02/2011 1749   HDL 49 05/02/2011 1749   CHOLHDL 5.5 05/02/2011 1749   VLDL 26 05/02/2011 1749   LDLCALC 194* 05/02/2011 1749   HgbA1C  Lab Results  Component Value Date   HGBA1C 8.8* 05/02/2011   Urine Drug Screen  No results found for this basename: labopia, cocainscrnur, labbenz, amphetmu, thcu, labbarb    Alcohol Level No results found for this basename: eth   CT of the brain  Vague hypoattenuation in the right lateral basal ganglia extending into the right corona radiata.  Although age indeterminate, given the clinical history,  subacute ischemia/infarct cannot be excluded.    MRI of the brain  1.  Acute right basal ganglia and adjacent white matter lacunar infarct.  No mass effect or hemorrhage. 2.  MRA findings are below.     MRA of the brain  Minimal to mild intracranial atherosclerosis.  No intracranial stenosis or major branch occlusion  2D Echocardiogram  EF 55-60% with no source of embolus.   Carotid Doppler  No internal carotid artery  stenosis bilaterally. Vertebrals with antegrade flow bilaterally.   CXR  Not ordered   EKG  normal sinus rhythm.   Physical Exam   He am Caucasian male currently not in distress. Afebrile. Head is nontraumatic. Neck is supple without bruit. Distal pulses are felt. There is no pedal edema.cardiac exam no murmur or gallop. Lungs are clear to auscultation. Neurological exam awake alert oriented x3 with normal speech and language function. No ophthalmoplegia or nystagmus. Mild left lower facial weakness. Tongue is midline. Motor system exam and no upper or lower exudative however significant weakness of left grip and intrinsic hand muscles. Orbits right or left upper extremity. No lower extremity weakness. Diminished coordination  and sensation on the left. Gait was not tested. ASSESSMENT Joe Boyle is a 46 y.o. male with a right basal ganglia infarct, secondary to small vessel disease. On aspirin 325 mg orally every day for secondary stroke prevention.  Stroke risk factors:  diabetes mellitus, hyperlipidemia and hypertension  Hospital day # 2  TREATMENT/PLAN Continue aspirin 325 mg orally every day for secondary stroke prevention. Ok for discharge home. Ongoing risk factor control. No work x 1 week. Please schedule an outpatient TCD bubble study with emboli monitoring with Dr. Pearlean Brownie in 1 month to further evaluate PFO. Have patient call for appointment.outpatient physical and occupational therapies . I had a long discussion with the patient and his wife and answered questions.  Joaquin Music, ANP-BC, GNP-BC [facility] Pager: [phone] 05/04/2011 11:21 AM  Dr. Delia Heady, Stroke Center Medical Director, has personally reviewed chart, pertinent data, examined the patient and developed the plan of care.

## 2011-05-04 NOTE — Progress Notes (Signed)
Occupational Therapy Treatment Patient Details Name: Joe Boyle MRN: [id number] DOB: [birth year] Today's Date: 05/04/2011  OT Assessment/Plan OT Assessment/Plan Follow Up Recommendations: Outpatient OT Equipment Recommended: None recommended by OT OT Goals Acute Rehab OT Goals OT Goal Formulation:  (eval only)  OT Treatment Exercises  Educated pt/wife in neuro re ed exercises in supine R sidelying and sitting. Exercises included proximal strengthening and scapular strengthening exercises. Aslo educated pt in fine motor and coordination exercises and activities. Wife and pt able to return demonstrate all exercises. Written infromation given. Also gave pt information on coping with emotional issues after stroke and educated on availability of stroke support group.     End of Session OT - End of Session Equipment Utilized During Treatment: Gait belt Activity Tolerance: Patient tolerated treatment well Patient left: in chair Nurse Communication: Other (comment) (need for outpatient services) General Behavior During Session: [facility] for tasks performed Cognition: [facility] for tasks performed  [place] [facility]  05/04/2011, 6:30 PM [facility], OTR/L  [phone] 05/04/2011

## 2011-05-04 NOTE — Progress Notes (Signed)
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 No change in left upper arm strength.  Objective: Blood pressure 106/70, pulse 68, temperature 98.3 F (36.8 C), temperature source Oral, resp. rate 18, height 5\' 8"  (1.727 m), weight 86.183 kg (190 lb), SpO2 96.00%.  Intake/Output Summary (Last 24 hours) at 05/04/11 0851 Last data filed at 05/03/11 2200  Gross per 24 hour  Intake    840 ml  Output      0 ml  Net    840 ml    General Exam: Comfortable. Eating breakfast. Respiratory System: Clear. No increased work of breathing. Cardiovascular System: First and second heart sounds heard. Regular rate and rhythm. No JVD/murmurs. Telemetry shows sinus rhythm. Gastrointestinal System: Abdomen is non distended, soft and normal bowel sounds heard. Central Nervous System: Alert and oriented. Left facial weakness. Dysarthria has resolved Extremities: Left upper extremity with grade 4 x 5 power. Rest of the limbs 5 x 5 power.  Lab Results: Basic Metabolic Panel:  Basename 05/03/11 0940 05/02/11 1748 05/02/11 1052  NA 132* -- 130*  K 5.1 -- 5.0  CL 97 -- 95*  CO2 25 -- 24  GLUCOSE 175* -- 189*  BUN 26* -- 23  CREATININE 0.90 0.87 --  CALCIUM 9.4 -- 9.4  MG -- -- --  PHOS -- -- --   Liver Function Tests:  Basename 05/03/11 0940  AST 17  ALT 28  ALKPHOS 65  BILITOT 0.4  PROT 7.4  ALBUMIN 3.8   No results found for this basename: LIPASE:2,AMYLASE:2 in the last 72 hours No results found for this basename: AMMONIA:2 in the last 72 hours CBC:  Basename 05/02/11 1748 05/02/11 1052  WBC 10.2 7.6  NEUTROABS -- --  HGB 12.8* 13.2  HCT 35.7* 36.4*  MCV 85.8 86.1  PLT 249 235   Cardiac Enzymes: No results found for this basename:  CKTOTAL:3,CKMB:3,CKMBINDEX:3,TROPONINI:3 in the last 72 hours BNP: No results found for this basename: PROBNP:3 in the last 72 hours D-Dimer: No results found for this basename: DDIMER:2 in the last 72 hours CBG:  Basename 05/04/11 0657 05/03/11 2300 05/03/11 1632 05/03/11 1132 05/03/11 0628 05/02/11 2324  GLUCAP 157* 151* 155* 160* 138* 122*   Hemoglobin A1C:  Basename 05/02/11 1748  HGBA1C 8.8*   Fasting Lipid Panel:  Basename 05/02/11 1749  CHOL 269*  HDL 49  LDLCALC 194*  TRIG 128  CHOLHDL 5.5  LDLDIRECT --    Studies/Results: Ct Head Wo Contrast  05/02/2011  *RADIOLOGY REPORT*  Clinical Data: Left-sided weakness and left facial droop.  Symptoms since none yesterday.  History of hypertension and diabetes.  CT HEAD WITHOUT CONTRAST  IMPRESSION: Vague hypoattenuation in the right lateral basal ganglia extending into the right corona radiata.  Although age indeterminate, given the clinical history,  subacute ischemia/infarct cannot be excluded.  Pre post contrast brain MR should be considered.  I personally called and discussed this report with Ebbie Ridge  at 1134 am on 05/02/2011.  Original Report Authenticated By: Consuello Bossier, M.D.   Mr [name] Head Wo Contrast  05/02/2011  *RADIOLOGY REPORT*  Clinical Data:  46 year old male with left side weakness, left facial droop.  History of diabetes.  MRI HEAD WITHOUT AND WITH CONTRAST   IMPRESSION: 1.  Acute right basal ganglia and adjacent white  matter lacunar infarct.  No mass effect or hemorrhage. 2.  MRA findings are below.  MRA HEAD WITHOUT CONTRAST  IMPRESSION: Minimal to mild intracranial atherosclerosis.  No intracranial stenosis or major branch occlusion.  Original Report Authenticated By: Harley Hallmark, M.D.     Medications: Scheduled Meds:    . aspirin  325 mg Oral Daily  . enoxaparin  40 mg Subcutaneous Q24H  . insulin aspart  0-9 Units Subcutaneous TID WC   Continuous Infusions:  PRN  Meds:.senna-docusate  Assessment/Plan: 1. Acute right basal ganglia and adjacent white matter lacunar infarct, with dysarthria and left upper extremity weakness: 2-D echocardiogram shows mild left ventricular hypertrophy and carotid Dopplers do not show any internal carotid artery stenosis. Physical therapy sees no needs. Await occupational therapy and speech therapy evaluation . Continue aspirin. Aim to control the secondary risk factors including diabetes, hypertension and dyslipidemia. 2. Uncontrolled type 2 diabetes mellitus: Poor outpatient control. Reasonable inpatient control. Patient declining insulins in the hospital. 3. Hypertension: Soft blood pressures. Antihypertensives on hold. 4. Dyslipidemia: Patient had severe myalgia with statins/Lipitor and hence cannot be used. Aim to control diabetes and recheck his lipid panel. Exercise and weight loss. 5. Hyponatremia: Stable 6. ? Medication non-compliance 7. Alcohol use/??abuse. Check CIWA score.  According to nursing report this morning, patient apparently has his medications with him and has been taking them in the hospital which the staff was not aware of. He is being advised by the nurses not to use his home medications while in hospital. Patient has also been declining insulins.  Disposition: For possible discharge home after stroke team followup and occupational therapy and speech therapy input.   HONGALGI,ANAND 05/04/2011, 8:51 AM

## 2011-05-04 NOTE — Plan of Care (Signed)
Problem: Phase II Progression Outcomes Goal: Other Phase II Outcomes/Goals Speech-language-cognitive assessment completed.  Minimal dysarthria, although speech is 100% intelligible in quiet environment.  No ST recommended.  Will provide pt. With handout on oral-motor exercises.  See full report for details.  Breck Coons Gar[place].Ed [employer] [phone]  05/04/2011

## 2011-05-04 NOTE — Progress Notes (Signed)
Occupational Therapy Evaluation Patient Details Name: Joe Boyle MRN: [id number] DOB: [birth year] Today's Date: 05/04/2011  Problem List:  Patient Active Problem List  Diagnoses  . CVA (cerebral infarction)  . Hypertension  . Diabetes mellitus  . Hyperlipidemia  . Left hemiparesis    Past Medical History:  Past Medical History  Diagnosis Date  . Hypertension   . Diabetes mellitus    Past Surgical History: History reviewed. No pertinent past surgical history.  OT Assessment/Plan/Recommendation OT Assessment Clinical Impression Statement: Pt s/p basa ganglia CVA with resulting LUE weakness. Pt will benefit from skilled outpatient OT to maximize functinoal use of LUE. OT Recommendation/Assessment: All further OT needs can be met in the next venue of care OT Problem List: Decreased strength;Decreased range of motion;Decreased coordination;Impaired UE functional use OT Therapy Diagnosis : Paresis (non dominant side) OT Recommendation Follow Up Recommendations: Outpatient OT Equipment Recommended: None recommended by OT Individuals Consulted Consulted and Agree with Results and Recommendations: Family member/caregiver;Patient Family Member Consulted: wife OT Goals Acute Rehab OT Goals OT Goal Formulation:  (eval only)  OT Evaluation Precautions/Restrictions  Precautions Required Braces or Orthoses: No Restrictions Weight Bearing Restrictions: No Prior Functioning Home Living Lives With: Spouse Type of Home: House Home Layout: Two level Alternate Level Stairs-Rails: Right Alternate Level Stairs-Number of Steps: 12 Home Access: Stairs to enter Entrance Stairs-Rails: Right;Left;Can reach both Entrance Stairs-Number of Steps: 3 Bathroom Accessibility: Yes How Accessible: Accessible via walker Home Adaptive Equipment: None Prior Function Level of Independence: Independent with basic ADLs;Independent with homemaking with ambulation;Independent with gait;Independent with  transfers Able to Take Stairs?: Reciprically Driving: Yes Vocation: Full time employment ADL ADL Eating/Feeding: Simulated;Set up Grooming: Wash/dry hands;Wash/dry face;Teeth care;Supervision/safety Where Assessed - Grooming: Standing at sink Upper Body Bathing: Simulated;Set up Where Assessed - Upper Body Bathing: Standing at sink Lower Body Bathing: Simulated;Set up Where Assessed - Lower Body Bathing: Sit to stand from bed Upper Body Dressing: Set up Where Assessed - Upper Body Dressing: Sitting, bed Lower Body Dressing: Set up;Simulated Where Assessed - Lower Body Dressing: Sit to stand from bed Toilet Transfer: Performed;Independent Toilet Transfer Method: Proofreader: Regular height toilet Toileting - Clothing Manipulation: Simulated;Independent Toileting - Hygiene: Simulated;Independent Tub/Shower Transfer: Simulated;Independent Tub/Shower Transfer Method: Ambulating Ambulation Related to ADLs: independent ADL Comments: Difficulty with fine motor tasks Vision/Perception  Vision - History Baseline Vision: No visual deficits Patient Visual Report: No change from baseline Vision - Assessment Eye Alignment: Within Functional Limits Vision Assessment: Vision not tested Perception Perception: Within Functional Limits Praxis Praxis: Intact Cognition Cognition Arousal/Alertness: Awake/alert Overall Cognitive Status: Appears within functional limits for tasks assessed Orientation Level: Oriented X4 Sensation/Coordination Sensation Light Touch: Appears Intact Stereognosis: Appears Intact Hot/Cold: Appears Intact Proprioception: Impaired Detail (minimally impaired) Proprioception Impaired Details: Impaired LUE Coordination Gross Motor Movements are Fluid and Coordinated: No Fine Motor Movements are Fluid and Coordinated: No Extremity Assessment RUE Assessment RUE Assessment: Within Functional Limits LUE Assessment LUE Assessment: Exceptions  to WFL LUE AROM (degrees) LUE Overall AROM Comments: Proximally @ 3+/5. distally @ 3/5 with the exception of hand. Decreased intrinsic control. Extensor lag and decreased isolation of movementsBrunstrom stage v LUE and IV hand. LUE Strength Grip (lbs): R 67#. L 32# Lateral Pinch:  (R 15. L11. Unmable to achieve tip pinch) Mobility  Bed Mobility Bed Mobility: No Transfers Transfers: Yes Sit to Stand: 7: Independent Stand to Sit: 7: Independent Exercises   End of Session OT - End of Session Equipment Utilized During Treatment: Gait belt Activity  Tolerance: Patient tolerated treatment well Patient left: in chair Nurse Communication: Other (comment) (need for outpatient services) General Behavior During Session: [facility] for tasks performed Cognition: [facility] for tasks performed   [facility] 05/04/2011, 6:28 PM  [facility], OTR/L  [phone] 05/04/2011

## 2011-05-04 NOTE — Discharge Summary (Signed)
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 He was confirmed to have acute right basal ganglia infarct and was not a candidate for TPA being outside the time window. He was admitted for further evaluation and management.   Discharge Medications Current Discharge Medication List    START taking these medications   Details  aspirin EC 325 MG tablet Take 1 tablet (325 mg total) by mouth daily.      CONTINUE these medications which have NOT CHANGED   Details  lisinopril (PRINIVIL,ZESTRIL) 20 MG tablet Take 20 mg by mouth daily.    metFORMIN (GLUCOPHAGE) 500 MG tablet Take 500 mg by mouth every evening.    metoprolol succinate (TOPROL-XL) 50 MG 24 hr tablet Take 50 mg by mouth daily.    sitaGLIPtin (JANUVIA) 100 MG tablet Take 100 mg by mouth daily.      STOP taking these medications     aspirin 325 MG tablet Comments:  Reason for Stopping:       aspirin 81 MG tablet Comments:  Reason for Stopping:          Hospital Course: 1. Right basal ganglia infarct, with associated facial asymmetry, dysarthria and left hemiparesis: Secondary to small vessel disease: Patient was admitted. Complete stroke evaluation was performed. CT of the brain showed vague hypo-attenuation in the right lateral basal ganglia  extending into the right corona radiate. MRI of the brain showed acute right basal ganglia and adjacent white matter lacunar infarct and no mass effect or hemorrhage. MRA of the brain showed minimal to mild intracranial atherosclerosis and no intracranial stenosis of the major branch occlusion. 2-D echocardiogram showed left ventricular ejection fraction of 55-60% with mild left ventricle hypertrophy with no source of embolus. Carotid Doppler showed no internal carotid artery stenosis bilaterally. Homocystine levels were normal. The stroke team was consulted. They recommend continuing 325 mg of aspirin daily for secondary stroke prevention and risk factor control including diabetes, hypertension and dyslipidemia. Physical therapy and speech therapy have evaluated him and do not see any discharge needs. Occupational therapy evaluation is pending. Stroke service has cleared him for discharge and patient is to followup outpatient with Dr. Pearlean Brownie for TCD bubble study in a month's time. 2. Uncontrolled type 2 diabetes mellitus as evidenced by hemoglobin A1c of 8.8. Patient indicates that he has been intolerant to multiple oral hypoglycemics. He is counseled to followup with his primary care physician to adjust his regimen to control his blood sugar adequately. He verbalizes understanding. 3. Hypertension: Controlled. Patient has been taking his own supply of medications while in the hospital which the staff were not aware of. He can continue his home ACE inhibitor and beta blockers. 4. Dyslipidemia: Patient complained of history of severe myalgia with statins/Lipitor. Followup with primary care physician to consider alternate therapy or referred to an endocrinologist. Patient will need some weight loss and exercise. 5. Hyponatremia: Unclear etiology. Patient is advised to followup with  his primary care physician with repeat BMP in 5-7 days from hospital discharge. His potassium is also on the upper end of  normal.   Day of Discharge BP 114/81  Pulse 78  Temp(Src) 97.9 F (36.6 C) (Oral)  Resp 18  Ht 5\' 8"  (1.727 m)  Wt 86.183 kg (190 lb)  BMI 28.89 kg/m2  SpO2 98%  General Exam: Comfortable. Eating breakfast.  Respiratory System: Clear. No increased work of breathing.  Cardiovascular System: First and second heart sounds heard. Regular rate and rhythm. No JVD/murmurs. Telemetry shows sinus rhythm.  Gastrointestinal System: Abdomen is non distended, soft and normal bowel sounds heard.  Central Nervous System: Alert and oriented. Left facial weakness. Dysarthria has resolved  Extremities: Left upper extremity with grade 4 x 5 power. Rest of the limbs 5 x 5 power.   Results for orders placed during the hospital encounter of 05/02/11 (from the past 48 hour(s))  URINALYSIS, ROUTINE W REFLEX MICROSCOPIC     Status: Abnormal   Collection Time   05/02/11  2:26 PM      Component Value Range Comment   Color, Urine YELLOW  YELLOW     APPearance CLEAR  CLEAR     Specific Gravity, Urine 1.016  1.005 - 1.030     pH 5.0  5.0 - 8.0     Glucose, UA 250 (*) NEGATIVE (mg/dL)    Hgb urine dipstick NEGATIVE  NEGATIVE     Bilirubin Urine NEGATIVE  NEGATIVE     Ketones, ur 15 (*) NEGATIVE (mg/dL)    Protein, ur 30 (*) NEGATIVE (mg/dL)    Urobilinogen, UA 0.2  0.0 - 1.0 (mg/dL)    Nitrite NEGATIVE  NEGATIVE     Leukocytes, UA NEGATIVE  NEGATIVE    URINE MICROSCOPIC-ADD ON     Status: Abnormal   Collection Time   05/02/11  2:26 PM      Component Value Range Comment   Squamous Epithelial / LPF FEW (*) RARE     Casts HYALINE CASTS (*) NEGATIVE    GLUCOSE, CAPILLARY     Status: Abnormal   Collection Time   05/02/11  4:35 PM      Component Value Range Comment   Glucose-Capillary 144 (*) 70 - 99 (mg/dL)   CBC     Status: Abnormal   Collection Time   05/02/11  5:48 PM      Component Value Range Comment   WBC 10.2  4.0 - 10.5 (K/uL)    RBC 4.16 (*) 4.22 - 5.81 (MIL/uL)    Hemoglobin 12.8 (*) 13.0 -  17.0 (g/dL)    HCT 08.6 (*) 57.8 - 52.0 (%)    MCV 85.8  78.0 - 100.0 (fL)    MCH 30.8  26.0 - 34.0 (pg)    MCHC 35.9  30.0 - 36.0 (g/dL)    RDW 46.9  62.9 - 52.8 (%)    Platelets 249  150 - 400 (K/uL)   CREATININE, SERUM     Status: Normal   Collection Time   05/02/11  5:48 PM      Component Value Range Comment   Creatinine, Ser 0.87  0.50 - 1.35 (mg/dL)    GFR calc non Af Amer >90  >90 (mL/min)    GFR calc Af Amer >90  >90 (mL/min)   HEMOGLOBIN A1C     Status: Abnormal   Collection Time   05/02/11  5:48 PM      Component Value Range Comment  Hemoglobin A1C 8.8 (*) <5.7 (%)    Mean Plasma Glucose 206 (*) <117 (mg/dL)   LIPID PANEL     Status: Abnormal   Collection Time   05/02/11  5:49 PM      Component Value Range Comment   Cholesterol 269 (*) 0 - 200 (mg/dL)    Triglycerides 454  <150 (mg/dL)    HDL 49  >09 (mg/dL)    Total CHOL/HDL Ratio 5.5      VLDL 26  0 - 40 (mg/dL)    LDL Cholesterol 811 (*) 0 - 99 (mg/dL)   GLUCOSE, CAPILLARY     Status: Abnormal   Collection Time   05/02/11 11:24 PM      Component Value Range Comment   Glucose-Capillary 122 (*) 70 - 99 (mg/dL)    Comment 1 Documented in Chart      Comment 2 Notify RN     GLUCOSE, CAPILLARY     Status: Abnormal   Collection Time   05/03/11  6:28 AM      Component Value Range Comment   Glucose-Capillary 138 (*) 70 - 99 (mg/dL)    Comment 1 Documented in Chart      Comment 2 Notify RN     PROTEIN C, TOTAL     Status: Normal   Collection Time   05/03/11  6:50 AM      Component Value Range Comment   Protein C, Total 126  72 - 160 (%)   PROTEIN S, TOTAL     Status: Normal   Collection Time   05/03/11  6:50 AM      Component Value Range Comment   Protein S Ag, Total 144  60 - 150 (%)   ANTITHROMBIN III     Status: Normal   Collection Time   05/03/11  6:50 AM      Component Value Range Comment   AntiThromb III Func 99  75 - 120 (%)   HOMOCYSTEINE, SERUM     Status: Normal   Collection Time   05/03/11  6:50 AM       Component Value Range Comment   Homocysteine-Norm 13.4  4.0 - 15.4 (umol/L)   TSH     Status: Normal   Collection Time   05/03/11  6:50 AM      Component Value Range Comment   TSH 2.189  0.350 - 4.500 (uIU/mL)   COMPREHENSIVE METABOLIC PANEL     Status: Abnormal   Collection Time   05/03/11  9:40 AM      Component Value Range Comment   Sodium 132 (*) 135 - 145 (mEq/L)    Potassium 5.1  3.5 - 5.1 (mEq/L)    Chloride 97  96 - 112 (mEq/L)    CO2 25  19 - 32 (mEq/L)    Glucose, Bld 175 (*) 70 - 99 (mg/dL)    BUN 26 (*) 6 - 23 (mg/dL)    Creatinine, Ser 9.14  0.50 - 1.35 (mg/dL)    Calcium 9.4  8.4 - 10.5 (mg/dL)    Total Protein 7.4  6.0 - 8.3 (g/dL)    Albumin 3.8  3.5 - 5.2 (g/dL)    AST 17  0 - 37 (U/L)    ALT 28  0 - 53 (U/L)    Alkaline Phosphatase 65  39 - 117 (U/L)    Total Bilirubin 0.4  0.3 - 1.2 (mg/dL)    GFR calc non Af Amer >90  >90 (mL/min)  GFR calc Af Amer >90  >90 (mL/min)   GLUCOSE, CAPILLARY     Status: Abnormal   Collection Time   05/03/11 11:32 AM      Component Value Range Comment   Glucose-Capillary 160 (*) 70 - 99 (mg/dL)   GLUCOSE, CAPILLARY     Status: Abnormal   Collection Time   05/03/11  4:32 PM      Component Value Range Comment   Glucose-Capillary 155 (*) 70 - 99 (mg/dL)   GLUCOSE, CAPILLARY     Status: Abnormal   Collection Time   05/03/11 11:00 PM      Component Value Range Comment   Glucose-Capillary 151 (*) 70 - 99 (mg/dL)    Comment 1 Documented in Chart      Comment 2 Notify RN     GLUCOSE, CAPILLARY     Status: Abnormal   Collection Time   05/04/11  6:57 AM      Component Value Range Comment   Glucose-Capillary 157 (*) 70 - 99 (mg/dL)   GLUCOSE, CAPILLARY     Status: Abnormal   Collection Time   05/04/11 11:39 AM      Component Value Range Comment   Glucose-Capillary 142 (*) 70 - 99 (mg/dL)     Ct Head Wo Contrast  05/02/2011  *RADIOLOGY REPORT*  Clinical Data: Left-sided weakness and left facial droop.  Symptoms since none  yesterday.  History of hypertension and diabetes.  CT HEAD WITHOUT CONTRAST  Technique:  Contiguous axial images were obtained from the base of the skull through the vertex without contrast.  Comparison: None.  Findings: Bone windows demonstrate minimal ethmoid air cell mucosal thickening.  Partially aerated petrous apices.  Soft tissue windows demonstrate age advanced carotid atherosclerosis.  No hemorrhage, hydrocephalus, mass lesion, intra- axial, or extra-axial fluid collection.  Vague hypoattenuation within the lateral aspect of the right basil ganglia, continuing into the coronar radiata.  Example images 13 - 17.  IMPRESSION: Vague hypoattenuation in the right lateral basal ganglia extending into the right corona radiata.  Although age indeterminate, given the clinical history,  subacute ischemia/infarct cannot be excluded.  Pre post contrast brain MR should be considered.  I personally called and discussed this report with Ebbie Ridge  at 1134 am on 05/02/2011.  Original Report Authenticated By: Consuello Bossier, M.D.   Mr [name] Head Wo Contrast  05/02/2011  *RADIOLOGY REPORT*  Clinical Data:  46 year old male with left side weakness, left facial droop.  History of diabetes.  MRI HEAD WITHOUT AND WITH CONTRAST  Technique: Multiplanar, multiecho pulse sequences of the brain and surrounding structures were obtained according to standard protocol without and with intravenous contrast.  Contrast: 18mL MULTIHANCE GADOBENATE DIMEGLUMINE 529 MG/ML IV SOLN  Comparison: Head CT without contrast 05/02/2011. [facility] Cervical MRI 04/07/2010.  Findings:  Confluent 13 mm focus of restricted diffusion tracks through the posterior right side white matter capsules to the right lentiform nuclei.  Associated T2 and FLAIR hyperintensity.  No mass effect or hemorrhage.  Diffusion signal elsewhere is within normal limits.  Major intracranial vascular flow voids are preserved. No midline shift,  ventriculomegaly, mass effect, evidence of mass lesion, extra-axial collection or acute intracranial hemorrhage. Cervicomedullary junction and pituitary are within normal limits. Scattered mostly subcortical T2 and FLAIR hyperintensity in the cerebral white matter outside of the acute area.  Brainstem and cerebellum are within normal limits.  Negative visualized cervical spine. Visualized bone marrow signal is within normal limits.  No abnormal  enhancement identified.  Mild paranasal sinus mucosal thickening.  Mastoids are clear. Visualized orbit soft tissues are within normal limits.  Negative visualized scalp soft tissues.  IMPRESSION: 1.  Acute right basal ganglia and adjacent white matter lacunar infarct.  No mass effect or hemorrhage. 2.  MRA findings are below.  MRA HEAD WITHOUT CONTRAST  Technique: Angiographic images of the Circle of Willis were obtained using MRA technique without  intravenous contrast.  Comparison: None.  Findings:  Antegrade flow in the posterior circulation with dominant distal left vertebral artery.  Normal PICA vessels. Normal vertebrobasilar junction.  Mild basilar artery irregularity, no basilar stenosis.  Superior cerebellar artery origins and PCA origins are within normal limits.  Bilateral PCA branches are within normal limits.  Posterior communicating arteries are diminutive or absent.  Antegrade flow in both ICA siphons.  Tortuous left cavernous ICA. No ICA stenosis.  Ophthalmic artery origins are within normal limits.  Normal carotid termini.  Normal MCA and ACA origins. Diminutive anterior communicating artery.  Visualized ACA branches are within normal limits.  Visualized left MCA branches are within normal limits.  Right MCA M1 segment is normal.  Small infundibulum at the right anterior temporal artery origin incidentally noted.  Visualized right MCA branches are within normal limits.  IMPRESSION: Minimal to mild intracranial atherosclerosis.  No intracranial stenosis or  major branch occlusion.  Original Report Authenticated By: Harley Hallmark, M.D.   Mr Laqueta Jean Wo Contrast  05/02/2011  *RADIOLOGY REPORT*  Clinical Data:  46 year old male with left side weakness, left facial droop.  History of diabetes.  MRI HEAD WITHOUT AND WITH CONTRAST  Technique: Multiplanar, multiecho pulse sequences of the brain and surrounding structures were obtained according to standard protocol without and with intravenous contrast.  Contrast: 18mL MULTIHANCE GADOBENATE DIMEGLUMINE 529 MG/ML IV SOLN  Comparison: Head CT without contrast 05/02/2011. [facility] Cervical MRI 04/07/2010.  Findings:  Confluent 13 mm focus of restricted diffusion tracks through the posterior right side white matter capsules to the right lentiform nuclei.  Associated T2 and FLAIR hyperintensity.  No mass effect or hemorrhage.  Diffusion signal elsewhere is within normal limits.  Major intracranial vascular flow voids are preserved. No midline shift, ventriculomegaly, mass effect, evidence of mass lesion, extra-axial collection or acute intracranial hemorrhage. Cervicomedullary junction and pituitary are within normal limits. Scattered mostly subcortical T2 and FLAIR hyperintensity in the cerebral white matter outside of the acute area.  Brainstem and cerebellum are within normal limits.  Negative visualized cervical spine. Visualized bone marrow signal is within normal limits.  No abnormal enhancement identified.  Mild paranasal sinus mucosal thickening.  Mastoids are clear. Visualized orbit soft tissues are within normal limits.  Negative visualized scalp soft tissues.  IMPRESSION: 1.  Acute right basal ganglia and adjacent white matter lacunar infarct.  No mass effect or hemorrhage. 2.  MRA findings are below.  MRA HEAD WITHOUT CONTRAST  Technique: Angiographic images of the Circle of Willis were obtained using MRA technique without  intravenous contrast.  Comparison: None.  Findings:  Antegrade  flow in the posterior circulation with dominant distal left vertebral artery.  Normal PICA vessels. Normal vertebrobasilar junction.  Mild basilar artery irregularity, no basilar stenosis.  Superior cerebellar artery origins and PCA origins are within normal limits.  Bilateral PCA branches are within normal limits.  Posterior communicating arteries are diminutive or absent.  Antegrade flow in both ICA siphons.  Tortuous left cavernous ICA. No ICA stenosis.  Ophthalmic artery origins are within  normal limits.  Normal carotid termini.  Normal MCA and ACA origins. Diminutive anterior communicating artery.  Visualized ACA branches are within normal limits.  Visualized left MCA branches are within normal limits.  Right MCA M1 segment is normal.  Small infundibulum at the right anterior temporal artery origin incidentally noted.  Visualized right MCA branches are within normal limits.  IMPRESSION: Minimal to mild intracranial atherosclerosis.  No intracranial stenosis or major branch occlusion.  Original Report Authenticated By: Harley Hallmark, M.D.     Disposition: Charge home in stable condition  Diet: Heart healthy diet  Activity: Increase activity gradually  Follow-up Appts: Discharge Orders    Future Orders Please Complete By Expires   Diet - low sodium heart healthy      Diet Carb Modified      Increase activity slowly      Discharge instructions      Comments:   Do not return to work for 1 week.      TESTS THAT NEED FOLLOW-UP BMP in 5-7 days from hospital discharge  Time spent on discharge, talking to the patient, and coordinating care: 40 mins.   SignedMarcellus Scott, MD 05/04/2011, 1:50 PM

## 2011-05-04 NOTE — Progress Notes (Signed)
On rounding this morning on patients i came to realize that Mr Dovidio had a bunch of home meds at the bedside. Patient told me that he has been taking his own pills ever since he got here and everybody is aware of that. I went ahead and notify MD about it. MD told me he was not aware. So has to educate patient about the cons of taking his own medicine here.the medications has been taken and kept at an appropriate place until discharge. Will continue to monitor him.

## 2011-05-05 LAB — CARDIOLIPIN ANTIBODIES, IGG, IGM, IGA
Anticardiolipin IgA: 6 APL U/mL — ABNORMAL LOW (ref <22)
Anticardiolipin IgM: 1 MPL U/mL — ABNORMAL LOW (ref <11)

## 2011-09-11 ENCOUNTER — Emergency Department (HOSPITAL_COMMUNITY)
Admission: EM | Admit: 2011-09-11 | Discharge: 2011-09-11 | Disposition: A | Discharge status: Home / Self Care | Payer: 59 | Attending: Emergency Medicine | Admitting: Emergency Medicine

## 2011-09-11 ENCOUNTER — Encounter (HOSPITAL_COMMUNITY): Payer: Self-pay | Admitting: Physical Medicine and Rehabilitation

## 2011-09-11 ENCOUNTER — Other Ambulatory Visit: Payer: Self-pay

## 2011-09-11 DIAGNOSIS — R209 Unspecified disturbances of skin sensation: Secondary | ICD-10-CM | POA: Insufficient documentation

## 2011-09-11 DIAGNOSIS — E119 Type 2 diabetes mellitus without complications: Secondary | ICD-10-CM | POA: Insufficient documentation

## 2011-09-11 DIAGNOSIS — I10 Essential (primary) hypertension: Secondary | ICD-10-CM | POA: Insufficient documentation

## 2011-09-11 DIAGNOSIS — Z8673 Personal history of transient ischemic attack (TIA), and cerebral infarction without residual deficits: Secondary | ICD-10-CM | POA: Insufficient documentation

## 2011-09-11 DIAGNOSIS — G459 Transient cerebral ischemic attack, unspecified: Secondary | ICD-10-CM

## 2011-09-11 DIAGNOSIS — Z87891 Personal history of nicotine dependence: Secondary | ICD-10-CM | POA: Insufficient documentation

## 2011-09-11 HISTORY — DX: Cerebral infarction, unspecified: I63.9

## 2011-09-11 LAB — COMPREHENSIVE METABOLIC PANEL WITH GFR
ALT: 29 U/L (ref 0–53)
AST: 27 U/L (ref 0–37)
Albumin: 4.2 g/dL (ref 3.5–5.2)
Alkaline Phosphatase: 67 U/L (ref 39–117)
BUN: 24 mg/dL — ABNORMAL HIGH (ref 6–23)
CO2: 27 meq/L (ref 19–32)
Calcium: 10.1 mg/dL (ref 8.4–10.5)
Chloride: 99 meq/L (ref 96–112)
Creatinine, Ser: 1.29 mg/dL (ref 0.50–1.35)
GFR calc Af Amer: 76 mL/min — ABNORMAL LOW
GFR calc non Af Amer: 66 mL/min — ABNORMAL LOW
Glucose, Bld: 156 mg/dL — ABNORMAL HIGH (ref 70–99)
Potassium: 5.6 meq/L — ABNORMAL HIGH (ref 3.5–5.1)
Sodium: 135 meq/L (ref 135–145)
Total Bilirubin: 0.3 mg/dL (ref 0.3–1.2)
Total Protein: 7.4 g/dL (ref 6.0–8.3)

## 2011-09-11 LAB — CBC
HCT: 35.9 % — ABNORMAL LOW (ref 39.0–52.0)
Hemoglobin: 12.6 g/dL — ABNORMAL LOW (ref 13.0–17.0)
MCH: 29.7 pg (ref 26.0–34.0)
MCHC: 35.1 g/dL (ref 30.0–36.0)
MCV: 84.7 fL (ref 78.0–100.0)
Platelets: 242 10*3/uL (ref 150–400)
RBC: 4.24 MIL/uL (ref 4.22–5.81)
RDW: 12.5 % (ref 11.5–15.5)
WBC: 9.2 10*3/uL (ref 4.0–10.5)

## 2011-09-11 LAB — GLUCOSE, CAPILLARY: Glucose-Capillary: 124 mg/dL — ABNORMAL HIGH (ref 70–99)

## 2011-09-11 NOTE — ED Notes (Signed)
Pt states that feels tired but he has no pain anywhere and no sob. Pt states he had the slurred talking for about 10 mins and then it went away. Pt states he has not had the symptoms for about over 2 hrs now.

## 2011-09-11 NOTE — ED Notes (Signed)
Pt presents to department for evaluation of possible TIA. Pt states this afternoon @ 17:30 he began experiencing R hand numbness/tingling and the inability to talk. Pt states this last approximately 7-82minutes then resolved. States previous stroke in January 2013 with L sided deficits. No new neuro deficits noted at the time. He is alert and oriented x4. Speech clear. Able to move all extremities. Denies pain.

## 2011-09-11 NOTE — ED Provider Notes (Signed)
History     CSN: [id number]  Arrival date & time 09/11/11  1742   First MD Initiated Contact with Patient 09/11/11 1946     8:13 PM HPI Patient reports at approximately 5:30 PM he began having right hand numbness and tingling. States within 5 minutes he had verbal speech. Reports entire episode lasted approximately 7-8 minutes and while on the way to the emergency department all symptoms resolved. Reports a history of a previous stroke January 2013. Reports he continues to have mild left-sided deficits. Denies chest pain, shortness of breath, headache, ataxia, numbness, tingling, weakness currently. Reports he feels he may have had a TIA.  HPI  Past Medical History  Diagnosis Date  . Hypertension   . Diabetes mellitus   . Stroke     No past surgical history on file.  History reviewed. No pertinent family history.  History  Substance Use Topics  . Smoking status: Former Smoker -- 1.0 packs/day    Quit date: 05/02/1999  . Smokeless tobacco: Not on file  . Alcohol Use: 4.8 oz/week    8 Cans of beer per week      Review of Systems  Constitutional: Negative for fever.  Respiratory: Negative for shortness of breath.   Cardiovascular: Negative for chest pain.  Gastrointestinal: Negative for nausea and vomiting.  Neurological: Positive for speech difficulty, weakness and numbness. Negative for dizziness and headaches.  All other systems reviewed and are negative.    Allergies  Penicillins  Home Medications   Current Outpatient Rx  Name Route Sig Dispense Refill  . LOSARTAN POTASSIUM 50 MG PO TABS Oral Take 50 mg by mouth daily.    Marland Kitchen METFORMIN HCL 500 MG PO TABS Oral Take 500 mg by mouth 3 (three) times daily.     Marland Kitchen METOPROLOL SUCCINATE ER 50 MG PO TB24 Oral Take 50 mg by mouth daily.    Marland Kitchen PITAVASTATIN CALCIUM 2 MG PO TABS Oral Take 2 mg by mouth daily.    Marland Kitchen SITAGLIPTIN PHOSPHATE 100 MG PO TABS Oral Take 100 mg by mouth daily.      BP 175/98  Pulse 79  Temp(Src) 98  F (36.7 C) (Oral)  Resp 18  SpO2 98%  Physical Exam  Constitutional: He is oriented to person, place, and time. He appears well-developed and well-nourished.  HENT:  Head: Normocephalic and atraumatic.  Right Ear: External ear normal.  Nose: Nose normal.  Mouth/Throat: Oropharynx is clear and moist. No oropharyngeal exudate.  Eyes: Conjunctivae and EOM are normal. Pupils are equal, round, and reactive to light. Right eye exhibits no discharge. Left eye exhibits no discharge.  Neck: Normal range of motion. Neck supple.  Cardiovascular: Normal rate, regular rhythm and normal heart sounds.  Exam reveals no gallop and no friction rub.   No murmur heard. Pulmonary/Chest: Effort normal and breath sounds normal. He has no wheezes. He has no rales. He exhibits no tenderness.  Abdominal: Soft. Bowel sounds are normal.  Musculoskeletal: Normal range of motion.  Lymphadenopathy:    He has no cervical adenopathy.  Neurological: He is alert and oriented to person, place, and time. No cranial nerve deficit (tested CN III-XII). He exhibits normal muscle tone (Normal hand grips). Coordination (neg pronator drift. No nystagmus. nml finger to nose. Normal ambulation) normal.  Skin: Skin is warm and dry. No rash noted. No erythema. No pallor.  Psychiatric: He has a normal mood and affect. His behavior is normal.    ED Course  Procedures  Results  for orders placed during the hospital encounter of 09/11/11  GLUCOSE, CAPILLARY      Component Value Range   Glucose-Capillary 124 (*) 70 - 99 (mg/dL)   Comment 1 Documented in Chart     Comment 2 Notify RN    CBC      Component Value Range   WBC 9.2  4.0 - 10.5 (K/uL)   RBC 4.24  4.22 - 5.81 (MIL/uL)   Hemoglobin 12.6 (*) 13.0 - 17.0 (g/dL)   HCT 27.2 (*) 53.6 - 52.0 (%)   MCV 84.7  78.0 - 100.0 (fL)   MCH 29.7  26.0 - 34.0 (pg)   MCHC 35.1  30.0 - 36.0 (g/dL)   RDW 64.4  03.4 - 74.2 (%)   Platelets 242  150 - 400 (K/uL)  COMPREHENSIVE METABOLIC  PANEL      Component Value Range   Sodium 135  135 - 145 (mEq/L)   Potassium 5.6 (*) 3.5 - 5.1 (mEq/L)   Chloride 99  96 - 112 (mEq/L)   CO2 27  19 - 32 (mEq/L)   Glucose, Bld 156 (*) 70 - 99 (mg/dL)   BUN 24 (*) 6 - 23 (mg/dL)   Creatinine, Ser 5.95  0.50 - 1.35 (mg/dL)   Calcium 63.8  8.4 - 10.5 (mg/dL)   Total Protein 7.4  6.0 - 8.3 (g/dL)   Albumin 4.2  3.5 - 5.2 (g/dL)   AST 27  0 - 37 (U/L)   ALT 29  0 - 53 (U/L)   Alkaline Phosphatase 67  39 - 117 (U/L)   Total Bilirubin 0.3  0.3 - 1.2 (mg/dL)   GFR calc non Af Amer 66 (*) >90 (mL/min)   GFR calc Af Amer 76 (*) >90 (mL/min)    MDM   Patient reports been completely asymptomatic since before 6:00 PM. Requesting to go home without a CT of the head. Patient is a Joe Boyle taking aspirin for prior stroke. Has a completely normal neurological exam. No focal neurological deficits. Recommended close followup with primary care physician either tomorrow or early next week. Patient voices understanding and is ready for discharge. I do suspect patient likely had a mild TIA so I discussed with patient that TIA could mean warning signs of  impending stroke. Patient voices understanding but would still like to be discharged.       Thomasene Lot, PA-C 09/13/11 0004

## 2011-09-11 NOTE — ED Notes (Signed)
CBG 124 

## 2011-09-11 NOTE — Discharge Instructions (Signed)
Transient Ischemic Attack A transient ischemic attack (TIA) is a "warning stroke" that causes stroke-like symptoms. Unlike a stroke, a TIA does not cause permanent damage to the brain. The symptoms of a TIA can happen very fast and do not last long. It is important to know the symptoms of a TIA and what to do. This can help prevent a major stroke or death. CAUSES   A TIA is caused by a temporary blockage in an artery in the brain or neck (carotid artery). The blockage does not allow the brain to get the blood supply it needs and can cause different symptoms. The blockage can be caused by either:   A blood clot.   Fatty buildup (plaque) in a neck or brain artery.  SYMPTOMS  TIA symptoms are the same as a stroke but are temporary. Symptoms can include sudden:  Numbness or weakness on one side of the body. Especially to the:   Face.   Arm.   Leg.   Trouble speaking, thinking, or confusion.   Change in vision, such as trouble seeing in one or both eyes.   Dizziness, loss of balance, or difficulty walking.   Severe headache.  ANY OF THESE SYMPTOMS MAY REPRESENT A SERIOUS PROBLEM THAT IS AN EMERGENCY. Do not wait to see if the symptoms will go away. Get medical help at once. Call your local emergency services (911 in [place] IMMEDIATELY. DO NOT drive yourself to the hospital. RISK FACTORS Risk factors can increase the risk of developing a TIA. These can include.   High blood pressure (hypertension).   High cholesterol (hyperlipidemia).   Heart disease (atherosclerosis).   Smoking.   Diabetes.   Abnormal heart rhythm (atrial fibrillation).   Family history of a stroke or heart attack.   Use of oral contraceptives (especially when combined with smoking).  DIAGNOSIS   A TIA can be diagnosed based on your:   Symptoms.   History.   Risk factors.   Tests that can help diagnose the symptoms of a TIA include:   CT or MRI scan. These tests can provide detailed images of the  brain.   Carotid ultrasound. This test looks to see if there are blockages in the carotid arteries of your neck.   Arteriography. A thin, small flexible tube (catheter) is inserted through a small cut (incision) in your groin. The catheter is threaded to your carotid or vertebral artery. A dye is then injected into the catheter. The dye highlights the arteries in your brain and allows your caregiver to look for narrowing or blockages that can cause a TIA.  TREATMENT  Based on the cause of a TIA, treatment options can vary. Treatment is important to help prevent a stroke. Treatment options can include:  Medication. Such as:   Clot-busting medicine.   Anti-platelet medicine.   Blood pressure medicine.   Blood thinner medicine.   Surgery:   Carotid endarterectomy. The carotid arteries are the arteries that supply the head and neck with oxygenated blood. This surgery can help remove fatty deposits (plaque) in the carotid arteries.   Angioplasty and stenting. This surgery uses a balloon to dilate a blocked artery in the brain. A stent is a small, metal mesh tube that can help keep an artery open  HOME CARE INSTRUCTIONS   It is important to take all medicine as told by your caregiver. If the medicine has side effects that affect you negatively, tell your caregiver right away. Do not stop taking medicine unless told   by your caregiver. Some medicines may need to be changed to better treat your condition.   Do not smoke. Talk to your caregiver on how to quit smoking.   Eat a diet high in fruits, vegetables and lean meat. Avoid a high fat, high salt diet. A dietician can you help you make healthy food choices.   Maintain a healthy weight. Develop an exercise plan approved by your caregiver.  SEEK IMMEDIATE MEDICAL CARE IF:   You develop weakness or numbness on one side of your body.   You have problems thinking, speaking, or feel confused.   You have vision changes.   You feel dizzy,  have trouble walking, or lose your balance.   You develop a severe headache.  MAKE SURE YOU:   Understand these instructions.   Will watch your condition.   Will get help right away if you are not doing well or get worse.  Document Released: 01/14/2005 Document Revised: 03/26/2011 Document Reviewed: 05/30/2009 [employer] Patient Information 2012 [employer]. 

## 2011-09-13 NOTE — ED Provider Notes (Signed)
Medical screening examination/treatment/procedure(s) were conducted as a shared visit with non-physician practitioner(s) and myself.  I personally evaluated the patient during the encounter On my exam the patient is in no distress.  He notes that his symptoms resolved spontaneously.  We discussed, at length, the possible causes of his symptoms, specifically the likelihood of TIA.  Given the patient had extensive evaluation for his prior episode of neurologic changes, was found to have acute infarct, and was discharged on aspirin, he was focal about wanting to go home.  We discussed, at length, the need for continued management and monitoring.  He was discharged with explicit instructions to return for concerning changes and to followup with for further evaluation and management.  Gerhard Munch, MD 09/13/11 [phone]

## 2011-12-01 ENCOUNTER — Other Ambulatory Visit (HOSPITAL_COMMUNITY): Payer: Self-pay | Admitting: Family Medicine

## 2011-12-01 DIAGNOSIS — R1011 Right upper quadrant pain: Secondary | ICD-10-CM

## 2011-12-09 ENCOUNTER — Other Ambulatory Visit (HOSPITAL_COMMUNITY): Payer: 59

## 2011-12-15 ENCOUNTER — Encounter (HOSPITAL_COMMUNITY)
Admission: RE | Admit: 2011-12-15 | Discharge: 2011-12-15 | Disposition: A | Discharge status: Home / Self Care | Payer: 59 | Source: Ambulatory Visit | Attending: Family Medicine | Admitting: Family Medicine

## 2011-12-15 DIAGNOSIS — R1011 Right upper quadrant pain: Secondary | ICD-10-CM | POA: Insufficient documentation

## 2011-12-15 MED ORDER — TECHNETIUM TC 99M MEBROFENIN IV KIT
5.3000 | PACK | Freq: Once | INTRAVENOUS | Status: AC | PRN
  Administered 2011-12-15: 5 via INTRAVENOUS

## 2012-11-30 ENCOUNTER — Other Ambulatory Visit (HOSPITAL_BASED_OUTPATIENT_CLINIC_OR_DEPARTMENT_OTHER): Payer: Self-pay | Admitting: Family Medicine

## 2012-11-30 DIAGNOSIS — M546 Pain in thoracic spine: Secondary | ICD-10-CM

## 2012-11-30 DIAGNOSIS — R1011 Right upper quadrant pain: Secondary | ICD-10-CM

## 2012-12-03 ENCOUNTER — Ambulatory Visit (HOSPITAL_BASED_OUTPATIENT_CLINIC_OR_DEPARTMENT_OTHER): Payer: 59

## 2013-02-27 ENCOUNTER — Inpatient Hospital Stay (HOSPITAL_COMMUNITY)
Admission: EM | Admit: 2013-02-27 | Discharge: 2013-02-28 | DRG: 066 | Disposition: A | Discharge status: Home / Self Care | Payer: 59 | Attending: Internal Medicine | Admitting: Internal Medicine

## 2013-02-27 ENCOUNTER — Encounter (HOSPITAL_COMMUNITY): Payer: Self-pay | Admitting: Emergency Medicine

## 2013-02-27 ENCOUNTER — Emergency Department (HOSPITAL_COMMUNITY): Payer: 59

## 2013-02-27 DIAGNOSIS — E119 Type 2 diabetes mellitus without complications: Secondary | ICD-10-CM | POA: Diagnosis present

## 2013-02-27 DIAGNOSIS — I639 Cerebral infarction, unspecified: Secondary | ICD-10-CM

## 2013-02-27 DIAGNOSIS — R29898 Other symptoms and signs involving the musculoskeletal system: Secondary | ICD-10-CM | POA: Diagnosis present

## 2013-02-27 DIAGNOSIS — Z880 Allergy status to penicillin: Secondary | ICD-10-CM

## 2013-02-27 DIAGNOSIS — Z79899 Other long term (current) drug therapy: Secondary | ICD-10-CM

## 2013-02-27 DIAGNOSIS — I10 Essential (primary) hypertension: Secondary | ICD-10-CM | POA: Diagnosis present

## 2013-02-27 DIAGNOSIS — Z8673 Personal history of transient ischemic attack (TIA), and cerebral infarction without residual deficits: Secondary | ICD-10-CM

## 2013-02-27 DIAGNOSIS — I6350 Cerebral infarction due to unspecified occlusion or stenosis of unspecified cerebral artery: Principal | ICD-10-CM | POA: Diagnosis present

## 2013-02-27 DIAGNOSIS — E139 Other specified diabetes mellitus without complications: Secondary | ICD-10-CM

## 2013-02-27 DIAGNOSIS — R4701 Aphasia: Secondary | ICD-10-CM | POA: Diagnosis present

## 2013-02-27 DIAGNOSIS — E785 Hyperlipidemia, unspecified: Secondary | ICD-10-CM | POA: Diagnosis present

## 2013-02-27 DIAGNOSIS — G8194 Hemiplegia, unspecified affecting left nondominant side: Secondary | ICD-10-CM

## 2013-02-27 DIAGNOSIS — Z7902 Long term (current) use of antithrombotics/antiplatelets: Secondary | ICD-10-CM

## 2013-02-27 DIAGNOSIS — Z87891 Personal history of nicotine dependence: Secondary | ICD-10-CM

## 2013-02-27 LAB — URINE MICROSCOPIC-ADD ON

## 2013-02-27 LAB — GLUCOSE, CAPILLARY
Glucose-Capillary: 149 mg/dL — ABNORMAL HIGH (ref 70–99)
Glucose-Capillary: 127 mg/dL — ABNORMAL HIGH (ref 70–99)

## 2013-02-27 LAB — CBC
HCT: 33.9 % — ABNORMAL LOW (ref 39.0–52.0)
Hemoglobin: 12.2 g/dL — ABNORMAL LOW (ref 13.0–17.0)
MCH: 30.9 pg (ref 26.0–34.0)
MCHC: 36 g/dL (ref 30.0–36.0)
MCV: 85.8 fL (ref 78.0–100.0)
RBC: 3.95 MIL/uL — ABNORMAL LOW (ref 4.22–5.81)

## 2013-02-27 LAB — COMPREHENSIVE METABOLIC PANEL
Alkaline Phosphatase: 54 U/L (ref 39–117)
BUN: 24 mg/dL — ABNORMAL HIGH (ref 6–23)
Creatinine, Ser: 0.96 mg/dL (ref 0.50–1.35)
GFR calc Af Amer: >90 mL/min (ref >90)
Glucose, Bld: 138 mg/dL — ABNORMAL HIGH (ref 70–99)
Potassium: 5.4 mEq/L — ABNORMAL HIGH (ref 3.5–5.1)
Total Bilirubin: 0.5 mg/dL (ref 0.3–1.2)
Total Protein: 7.2 g/dL (ref 6.0–8.3)

## 2013-02-27 LAB — POCT I-STAT, CHEM 8
BUN: 25 mg/dL — ABNORMAL HIGH (ref 6–23)
Calcium, Ion: 1.24 mmol/L — ABNORMAL HIGH (ref 1.12–1.23)
Chloride: 98 mEq/L (ref 96–112)
Glucose, Bld: 140 mg/dL — ABNORMAL HIGH (ref 70–99)
HCT: 36 % — ABNORMAL LOW (ref 39.0–52.0)
Potassium: 5 mEq/L (ref 3.5–5.1)
Sodium: 132 mEq/L — ABNORMAL LOW (ref 135–145)

## 2013-02-27 LAB — URINALYSIS, ROUTINE W REFLEX MICROSCOPIC
Glucose, UA: NEGATIVE mg/dL
Leukocytes, UA: NEGATIVE
pH: 5 (ref 5.0–8.0)

## 2013-02-27 LAB — RAPID URINE DRUG SCREEN, HOSP PERFORMED
Benzodiazepines: NOT DETECTED
Opiates: NOT DETECTED

## 2013-02-27 LAB — PROTIME-INR: INR: 1.03 (ref 0.00–1.49)

## 2013-02-27 LAB — HEMOGLOBIN A1C: Mean Plasma Glucose: 128 mg/dL — ABNORMAL HIGH (ref <117)

## 2013-02-27 LAB — TROPONIN I: Troponin I: <0.3 ng/mL (ref <0.30)

## 2013-02-27 LAB — POCT I-STAT TROPONIN I: Troponin i, poc: 0.01 ng/mL (ref 0.00–0.08)

## 2013-02-27 LAB — DIFFERENTIAL
Eosinophils Absolute: 0.6 10*3/uL (ref 0.0–0.7)
Lymphs Abs: 1 10*3/uL (ref 0.7–4.0)
Monocytes Absolute: 0.5 10*3/uL (ref 0.1–1.0)
Monocytes Relative: 9 % (ref 3–12)
Neutrophils Relative %: 60 % (ref 43–77)

## 2013-02-27 MED ORDER — SIMVASTATIN 20 MG PO TABS
20.0000 mg | ORAL_TABLET | Freq: Every day | ORAL | Status: DC
  Filled 2013-02-27: qty 1

## 2013-02-27 MED ORDER — SODIUM CHLORIDE 0.9 % IV SOLN
INTRAVENOUS | Status: DC
  Administered 2013-02-27 – 2013-02-28 (×2): via INTRAVENOUS

## 2013-02-27 MED ORDER — SITAGLIPTIN PHOSPHATE 100 MG PO TABS
100.0000 mg | ORAL_TABLET | Freq: Every day | ORAL | Status: DC
  Filled 2013-02-27: qty 1

## 2013-02-27 MED ORDER — PRAVASTATIN SODIUM 40 MG PO TABS
40.0000 mg | ORAL_TABLET | Freq: Every day | ORAL | Status: DC
  Administered 2013-02-27: 40 mg via ORAL
  Filled 2013-02-27 (×2): qty 1

## 2013-02-27 MED ORDER — METOPROLOL SUCCINATE ER 50 MG PO TB24: 50.0000 mg | ORAL_TABLET | Freq: Every day | ORAL | Status: DC

## 2013-02-27 MED ORDER — SENNOSIDES-DOCUSATE SODIUM 8.6-50 MG PO TABS: 1.0000 | ORAL_TABLET | Freq: Every evening | ORAL | Status: DC | PRN

## 2013-02-27 MED ORDER — METFORMIN HCL ER 750 MG PO TB24
750.0000 mg | ORAL_TABLET | Freq: Two times a day (BID) | ORAL | Status: DC
  Administered 2013-02-27 – 2013-02-28 (×2): 750 mg via ORAL
  Filled 2013-02-27 (×4): qty 1

## 2013-02-27 MED ORDER — INSULIN ASPART 100 UNIT/ML ~~LOC~~ SOLN: 0.0000 [IU] | Freq: Three times a day (TID) | SUBCUTANEOUS | Status: DC

## 2013-02-27 MED ORDER — LINAGLIPTIN 5 MG PO TABS
5.0000 mg | ORAL_TABLET | Freq: Every day | ORAL | Status: DC
  Filled 2013-02-27: qty 1

## 2013-02-27 MED ORDER — METFORMIN HCL ER 750 MG PO TB24
750.0000 mg | ORAL_TABLET | Freq: Every day | ORAL | Status: DC
  Filled 2013-02-27: qty 1

## 2013-02-27 MED ORDER — NON FORMULARY: 100.0000 mg | Freq: Every day | Status: DC

## 2013-02-27 MED ORDER — METOPROLOL SUCCINATE ER 50 MG PO TB24
50.0000 mg | ORAL_TABLET | Freq: Every day | ORAL | Status: DC
  Administered 2013-02-27: 50 mg via ORAL
  Filled 2013-02-27 (×2): qty 1

## 2013-02-27 MED ORDER — CLOPIDOGREL BISULFATE 75 MG PO TABS
75.0000 mg | ORAL_TABLET | Freq: Every day | ORAL | Status: DC
  Filled 2013-02-27 (×2): qty 1

## 2013-02-27 MED ORDER — ENOXAPARIN SODIUM 40 MG/0.4ML ~~LOC~~ SOLN
40.0000 mg | SUBCUTANEOUS | Status: DC
  Administered 2013-02-27: 40 mg via SUBCUTANEOUS
  Filled 2013-02-27 (×2): qty 0.4

## 2013-02-27 NOTE — H&P (Addendum)
[facility] History and Physical  Joe Boyle YQM:[id number] DOB: 08-08-1965 DOA: 02/27/2013  Referring physician:  PCP: Lenora Boys, MD  Specialists:  Chief Complaint: L sided weakness   HPI: Joe Boyle is a 47 y.o. male with PMH of HTN, HPL, DM, h/o CVA (2013) presented with L sided arm weakness and aphasia since morning found to have new acute CVA after a stressful day yesterday; he denies, other focal neuro symptoms, no gait ataxia, no change in vision, or hearing;  -the past he was placed on ASA but was changed to Aggrenox in May due to TIA. He was unable to tolerate Aggrenox (HA) and was switched to Plavix   Review of Systems: The patient denies anorexia, fever, weight loss,, vision loss, decreased hearing, hoarseness, chest pain, syncope, dyspnea on exertion, peripheral edema, balance deficits, hemoptysis, abdominal pain, melena, hematochezia, severe indigestion/heartburn, hematuria, incontinence, genital sores, suspicious skin lesions, transient blindness, difficulty walking, depression, unusual weight change, abnormal bleeding, enlarged lymph nodes, angioedema, and breast masses.    Past Medical History  Diagnosis Date  . Hypertension   . Diabetes mellitus   . Stroke    History reviewed. No pertinent past surgical history. Social History:  reports that he quit smoking about 13 years ago. He does not have any smokeless tobacco history on file. He reports that he drinks about 4.8 ounces of alcohol per week. He reports that he does not use illicit drugs. Home:  where does patient live--home, ALF, SNF? and with whom if at home? Yes:  Can patient participate in ADLs?  Allergies  Allergen Reactions  . Penicillins Other (See Comments)    From when younger.  . Tylenol [Acetaminophen] Other (See Comments)    Childhood allergy    No family history on file. no h/o CAD, or CVA  (be sure to complete)  Prior to Admission medications   Medication Sig Start Date End Date Taking?  Authorizing Provider  clopidogrel (PLAVIX) 75 MG tablet Take 75 mg by mouth daily with breakfast.   Yes Historical Provider, MD  losartan (COZAAR) 50 MG tablet Take 50 mg by mouth daily.   Yes Historical Provider, MD  metFORMIN (GLUCOPHAGE-XR) 750 MG 24 hr tablet Take 750 mg by mouth 2 (two) times daily.   Yes Historical Provider, MD  metoprolol succinate (TOPROL-XL) 50 MG 24 hr tablet Take 50 mg by mouth daily.   Yes Historical Provider, MD  pravastatin (PRAVACHOL) 40 MG tablet Take 40 mg by mouth daily.   Yes Historical Provider, MD  sitaGLIPtin (JANUVIA) 100 MG tablet Take 100 mg by mouth daily.   Yes Historical Provider, MD   Physical Exam: Filed Vitals:   02/27/13 1434  BP: 145/89  Pulse: 72  Temp:   Resp: 12     General:  alert  Eyes: EOM-i, PERRLA  ENT: no oral ulcers  Neck: supple   Cardiovascular: s1,s2 rrr  Respiratory: CTA BL   Abdomen: soft, nt, nd   Skin: no rash   Musculoskeletal: no LE edema  Psychiatric: no hallucinations   Neurologic: L facial droop, otherwise CN intact; motor 5/5 symmetric   Labs on Admission:  Basic Metabolic Panel:  Recent Labs Lab 02/27/13 1037 02/27/13 1054  NA 131* 132*  K 5.4* 5.0  CL 96 98  CO2 25  --   GLUCOSE 138* 140*  BUN 24* 25*  CREATININE 0.96 1.20  CALCIUM 9.3  --    Liver Function Tests:  Recent Labs Lab 02/27/13 1037  AST 22  ALT 22  ALKPHOS 54  BILITOT 0.5  PROT 7.2  ALBUMIN 4.1   No results found for this basename: LIPASE, AMYLASE,  in the last 168 hours No results found for this basename: AMMONIA,  in the last 168 hours CBC:  Recent Labs Lab 02/27/13 1037 02/27/13 1054  WBC 5.2  --   NEUTROABS 3.1  --   HGB 12.2* 12.2*  HCT 33.9* 36.0*  MCV 85.8  --   PLT 218  --    Cardiac Enzymes:  Recent Labs Lab 02/27/13 1035  TROPONINI <0.30    BNP (last 3 results) No results found for this basename: PROBNP,  in the last 8760 hours CBG:  Recent Labs Lab 02/27/13 0920  GLUCAP  149*    Radiological Exams on Admission: Ct Head Wo Contrast  02/27/2013   CLINICAL DATA:  Left-sided weakness  EXAM: CT HEAD WITHOUT CONTRAST  TECHNIQUE: Contiguous axial images were obtained from the base of the skull through the vertex without intravenous contrast.  COMPARISON:  05/02/2011  FINDINGS: Lacunar infarct in the right basal ganglia noted at the location of acute ischemia seen on the previous study. No evidence to suggest acute ischemia by CT on today's study. No acute hemorrhage, hydrocephalus, or mass lesion. No evidence for mass effect.  Scattered mucosal disease noted in the left frontal sinus and ethmoid air cells bilaterally.  IMPRESSION: No acute findings on today's exam.  Old lacunar infarct involving the right basal ganglia.   Electronically Signed   By: Kennith Center M.D.   On: 02/27/2013 11:33   Mr [name] Head Wo Contrast  02/27/2013   CLINICAL DATA:  Left facial droop. Slurred speech.  EXAM: MRI HEAD WITHOUT CONTRAST  MRA HEAD WITHOUT CONTRAST  TECHNIQUE: Multiplanar, multiecho pulse sequences of the brain and surrounding structures were obtained without intravenous contrast. Angiographic images of the head were obtained using MRA technique without contrast.  COMPARISON:  Head CT 02/27/2013. MRI 05/02/2011.  FINDINGS: MRI HEAD FINDINGS  Diffusion imaging shows acute infarction recurrent in the region of the right basal ganglia. There is a 3 mm acute infarction within the acute putamen. There is a 2 cm region of infarction involving the upper basal ganglia and radiating white matter tracts. These infarctions do not show hemorrhage or mass effect. No other areas of acute infarction are identified.  The brainstem and cerebellum are normal. The cerebral hemispheres elsewhere show old infarction in the right posterior putaminal (acute in January of 2013). There are moderate chronic small vessel changes throughout the deep and subcortical white matter. No cortical or large vessel territory  infarction. No neoplastic mass lesion. No sign of acute hemorrhage. Minimal residual blood products in the old right basal ganglia infarction. No hydrocephalus. No extra-axial collection. No pituitary mass. There are mucosal inflammatory changes affecting the paranasal sinuses. No skull or skullbase lesion.  MRA HEAD FINDINGS  Both internal carotid arteries are widely patent into the brain. No siphon stenosis. The anterior and middle cerebral vessels are patent without flow-limiting stenosis, aneurysm or vascular malformation. There may be very minimal irregularity of the M1 segments bilaterally.  Both vertebral arteries are patent with the left being dominant. No basilar stenosis. Posterior circulation branch vessels appear normal.  IMPRESSION: Acute infarction affecting the right putamen and the upper basal ganglia and adjacent radiating white matter tracts. No acute hemorrhage or swelling/ mass effect.  Old infarction in the right basal ganglia. Chronic small-vessel changes affecting the cerebral hemispheric white matter.  MR angiography  of the large and medium size vessels is normal except for mild irregularity of the M1 segments without flow-limiting stenosis.   Electronically Signed   By: Paulina Fusi M.D.   On: 02/27/2013 14:01   Mr Brain Wo Contrast  02/27/2013   CLINICAL DATA:  Left facial droop. Slurred speech.  EXAM: MRI HEAD WITHOUT CONTRAST  MRA HEAD WITHOUT CONTRAST  TECHNIQUE: Multiplanar, multiecho pulse sequences of the brain and surrounding structures were obtained without intravenous contrast. Angiographic images of the head were obtained using MRA technique without contrast.  COMPARISON:  Head CT 02/27/2013. MRI 05/02/2011.  FINDINGS: MRI HEAD FINDINGS  Diffusion imaging shows acute infarction recurrent in the region of the right basal ganglia. There is a 3 mm acute infarction within the acute putamen. There is a 2 cm region of infarction involving the upper basal ganglia and radiating white  matter tracts. These infarctions do not show hemorrhage or mass effect. No other areas of acute infarction are identified.  The brainstem and cerebellum are normal. The cerebral hemispheres elsewhere show old infarction in the right posterior putaminal (acute in January of 2013). There are moderate chronic small vessel changes throughout the deep and subcortical white matter. No cortical or large vessel territory infarction. No neoplastic mass lesion. No sign of acute hemorrhage. Minimal residual blood products in the old right basal ganglia infarction. No hydrocephalus. No extra-axial collection. No pituitary mass. There are mucosal inflammatory changes affecting the paranasal sinuses. No skull or skullbase lesion.  MRA HEAD FINDINGS  Both internal carotid arteries are widely patent into the brain. No siphon stenosis. The anterior and middle cerebral vessels are patent without flow-limiting stenosis, aneurysm or vascular malformation. There may be very minimal irregularity of the M1 segments bilaterally.  Both vertebral arteries are patent with the left being dominant. No basilar stenosis. Posterior circulation branch vessels appear normal.  IMPRESSION: Acute infarction affecting the right putamen and the upper basal ganglia and adjacent radiating white matter tracts. No acute hemorrhage or swelling/ mass effect.  Old infarction in the right basal ganglia. Chronic small-vessel changes affecting the cerebral hemispheric white matter.  MR angiography of the large and medium size vessels is normal except for mild irregularity of the M1 segments without flow-limiting stenosis.   Electronically Signed   By: Paulina Fusi M.D.   On: 02/27/2013 14:01    EKG: Independently reviewed. NSR, no acute ST/T changes   Assessment/Plan Active Problems:   Hypertension   Acute CVA (cerebrovascular accident)   Diabetes 1.5, managed as type 2   47 y.o. male with PMH of HTN, HPL, DM, h/o CVA (2013) presented with L sided arm  weakness and aphasia since morning found to have new acute CVA  1. Acute CVA likely hypertensive; NWG:NFAOZ infarction affecting the right putamen and the upper basal ganglia and adjacent radiating white matter tracts -monitor on tele; hyper-coag panel; CVA work up per neurology; cont palvix, ? Add ASA; control risk factors/BP  2. HTN, permissive HTN; hold ARB, cont BB; monitor closely   3. DM last HA1C-8.8 (2013) -recheck HA1C; resume home meds;  ISS for now   4. HPL; cont statin       Neurology:  if consultant consulted, please document name and whether formally or informally consulted  Code Status: full (must indicate code status--if unknown or must be presumed, indicate so) Family Communication: none at the bedside (indicate person spoken with, if applicable, with phone number if by telephone) Disposition Plan: home likely in AM  (  indicate anticipated LOS)  Time spent: > 35 minutes   Esperanza Sheets [facility] Pager [phone]  If 7PM-7AM, please contact night-coverage [email or web address] Password [facility] 02/27/2013, 3:42 PM

## 2013-02-27 NOTE — ED Notes (Signed)
MD at bedside. Neuro MD at bedside. 

## 2013-02-27 NOTE — ED Provider Notes (Signed)
CSN: [id number]     Arrival date & time 02/27/13  0910 History   First MD Initiated Contact with Patient 02/27/13 [phone]     Chief Complaint  Patient presents with  . Stroke Symptoms   (Consider location/radiation/quality/duration/timing/severity/associated sxs/prior Treatment) HPI Comments: Patient is a 47 year old male with history of prior stroke, diabetes, hypertension who presents today with left sided weakness. He reports that he hasn't been feeling well for the past few days, but woke up this morning and was more unsteady on his feet than normal. He got out of bed and went to his dresser and grabbed a pair of socks. He preceded to put his socks on and when he went to put his socks on he toppled off the bed. This happened at approximately 730 this morning. He went to work, but had trouble typing on the keyboard. This prompted him to come to the ED. He states this does not feel like his prior stroke. He has not had a headache at all today. He has some mild residual weakness from his prior stroke in the past. Currently his major complaint is the sensation that he has lost motor skills in his left arm.   The history is provided by the patient. No language interpreter was used.    Past Medical History  Diagnosis Date  . Hypertension   . Diabetes mellitus   . Stroke    History reviewed. No pertinent past surgical history. No family history on file. History  Substance Use Topics  . Smoking status: Former Smoker -- 1.00 packs/day    Quit date: 05/02/1999  . Smokeless tobacco: Not on file  . Alcohol Use: 4.8 oz/week    8 Cans of beer per week    Review of Systems  Respiratory: Negative for shortness of breath.   Cardiovascular: Negative for chest pain.  Gastrointestinal: Negative for nausea, vomiting and abdominal pain.  Neurological: Positive for weakness. Negative for headaches.  All other systems reviewed and are negative.    Allergies  Penicillins and Tylenol  Home  Medications   Current Outpatient Rx  Name  Route  Sig  Dispense  Refill  . losartan (COZAAR) 50 MG tablet   Oral   Take 50 mg by mouth daily.         . metFORMIN (GLUCOPHAGE) 500 MG tablet   Oral   Take 500 mg by mouth 3 (three) times daily.          . metoprolol succinate (TOPROL-XL) 50 MG 24 hr tablet   Oral   Take 50 mg by mouth daily.         . Pitavastatin Calcium (LIVALO) 2 MG TABS   Oral   Take 2 mg by mouth daily.         . sitaGLIPtin (JANUVIA) 100 MG tablet   Oral   Take 100 mg by mouth daily.          BP 143/78  Pulse 70  Temp(Src) 98.6 F (37 C) (Oral)  Resp 14  SpO2 98% Physical Exam  Nursing note and vitals reviewed. Constitutional: He is oriented to person, place, and time. He appears well-developed and well-nourished. No distress.  HENT:  Head: Normocephalic and atraumatic.  Right Ear: External ear normal.  Left Ear: External ear normal.  Nose: Nose normal.  Eyes: Conjunctivae and EOM are normal. Pupils are equal, round, and reactive to light.  Neck: Normal range of motion. No tracheal deviation present.  Cardiovascular: Normal rate, regular  rhythm and normal heart sounds.   Pulmonary/Chest: Effort normal and breath sounds normal. No stridor.  Abdominal: Soft. He exhibits no distension. There is no tenderness.  Musculoskeletal: Normal range of motion.  Neurological: He is alert and oriented to person, place, and time.  Pronator drift on left. Strength in left arm decreased. Shoulder strength 5/5 bilaterally. Finger nose finger normal.  Gait is not ataxic.   Skin: Skin is warm and dry. He is not diaphoretic.  Psychiatric: He has a normal mood and affect. His behavior is normal.    ED Course  Procedures (including critical care time) Labs Review Labs Reviewed  GLUCOSE, CAPILLARY - Abnormal; Notable for the following:    Glucose-Capillary 149 (*)    All other components within normal limits  CBC - Abnormal; Notable for the following:     RBC 3.95 (*)    Hemoglobin 12.2 (*)    HCT 33.9 (*)    All other components within normal limits  DIFFERENTIAL - Abnormal; Notable for the following:    Eosinophils Relative 11 (*)    All other components within normal limits  COMPREHENSIVE METABOLIC PANEL - Abnormal; Notable for the following:    Sodium 131 (*)    Potassium 5.4 (*)    Glucose, Bld 138 (*)    BUN 24 (*)    All other components within normal limits  URINALYSIS, ROUTINE W REFLEX MICROSCOPIC - Abnormal; Notable for the following:    Protein, ur 30 (*)    All other components within normal limits  URINE MICROSCOPIC-ADD ON - Abnormal; Notable for the following:    Squamous Epithelial / LPF FEW (*)    Casts HYALINE CASTS (*)    All other components within normal limits  GLUCOSE, CAPILLARY - Abnormal; Notable for the following:    Glucose-Capillary 127 (*)    All other components within normal limits  POCT I-STAT, CHEM 8 - Abnormal; Notable for the following:    Sodium 132 (*)    BUN 25 (*)    Glucose, Bld 140 (*)    Calcium, Ion 1.24 (*)    Hemoglobin 12.2 (*)    HCT 36.0 (*)    All other components within normal limits  ETHANOL  PROTIME-INR  APTT  TROPONIN I  URINE RAPID DRUG SCREEN (HOSP PERFORMED)  LIPID PANEL  HEMOGLOBIN A1C  CBC  CREATININE, SERUM  ANTITHROMBIN III  PROTEIN C ACTIVITY  PROTEIN C, TOTAL  PROTEIN S ACTIVITY  PROTEIN S, TOTAL  LUPUS ANTICOAGULANT PANEL  BETA-2-GLYCOPROTEIN I ABS, IGG/M/A  HOMOCYSTEINE  FACTOR 5 LEIDEN  PROTHROMBIN GENE MUTATION  CARDIOLIPIN ANTIBODIES, IGG, IGM, IGA  HEMOGLOBIN A1C  LIPID PANEL  POCT I-STAT TROPONIN I   Imaging Review Ct Head Wo Contrast  02/27/2013   CLINICAL DATA:  Left-sided weakness  EXAM: CT HEAD WITHOUT CONTRAST  TECHNIQUE: Contiguous axial images were obtained from the base of the skull through the vertex without intravenous contrast.  COMPARISON:  05/02/2011  FINDINGS: Lacunar infarct in the right basal ganglia noted at the location of  acute ischemia seen on the previous study. No evidence to suggest acute ischemia by CT on today's study. No acute hemorrhage, hydrocephalus, or mass lesion. No evidence for mass effect.  Scattered mucosal disease noted in the left frontal sinus and ethmoid air cells bilaterally.  IMPRESSION: No acute findings on today's exam.  Old lacunar infarct involving the right basal ganglia.   Electronically Signed   By: Kennith Center M.D.   On:  02/27/2013 11:33   Mr [name] Head Wo Contrast  02/27/2013   CLINICAL DATA:  Left facial droop. Slurred speech.  EXAM: MRI HEAD WITHOUT CONTRAST  MRA HEAD WITHOUT CONTRAST  TECHNIQUE: Multiplanar, multiecho pulse sequences of the brain and surrounding structures were obtained without intravenous contrast. Angiographic images of the head were obtained using MRA technique without contrast.  COMPARISON:  Head CT 02/27/2013. MRI 05/02/2011.  FINDINGS: MRI HEAD FINDINGS  Diffusion imaging shows acute infarction recurrent in the region of the right basal ganglia. There is a 3 mm acute infarction within the acute putamen. There is a 2 cm region of infarction involving the upper basal ganglia and radiating white matter tracts. These infarctions do not show hemorrhage or mass effect. No other areas of acute infarction are identified.  The brainstem and cerebellum are normal. The cerebral hemispheres elsewhere show old infarction in the right posterior putaminal (acute in January of 2013). There are moderate chronic small vessel changes throughout the deep and subcortical white matter. No cortical or large vessel territory infarction. No neoplastic mass lesion. No sign of acute hemorrhage. Minimal residual blood products in the old right basal ganglia infarction. No hydrocephalus. No extra-axial collection. No pituitary mass. There are mucosal inflammatory changes affecting the paranasal sinuses. No skull or skullbase lesion.  MRA HEAD FINDINGS  Both internal carotid arteries are widely patent  into the brain. No siphon stenosis. The anterior and middle cerebral vessels are patent without flow-limiting stenosis, aneurysm or vascular malformation. There may be very minimal irregularity of the M1 segments bilaterally.  Both vertebral arteries are patent with the left being dominant. No basilar stenosis. Posterior circulation branch vessels appear normal.  IMPRESSION: Acute infarction affecting the right putamen and the upper basal ganglia and adjacent radiating white matter tracts. No acute hemorrhage or swelling/ mass effect.  Old infarction in the right basal ganglia. Chronic small-vessel changes affecting the cerebral hemispheric white matter.  MR angiography of the large and medium size vessels is normal except for mild irregularity of the M1 segments without flow-limiting stenosis.   Electronically Signed   By: Paulina Fusi M.D.   On: 02/27/2013 14:01   Mr Brain Wo Contrast  02/27/2013   CLINICAL DATA:  Left facial droop. Slurred speech.  EXAM: MRI HEAD WITHOUT CONTRAST  MRA HEAD WITHOUT CONTRAST  TECHNIQUE: Multiplanar, multiecho pulse sequences of the brain and surrounding structures were obtained without intravenous contrast. Angiographic images of the head were obtained using MRA technique without contrast.  COMPARISON:  Head CT 02/27/2013. MRI 05/02/2011.  FINDINGS: MRI HEAD FINDINGS  Diffusion imaging shows acute infarction recurrent in the region of the right basal ganglia. There is a 3 mm acute infarction within the acute putamen. There is a 2 cm region of infarction involving the upper basal ganglia and radiating white matter tracts. These infarctions do not show hemorrhage or mass effect. No other areas of acute infarction are identified.  The brainstem and cerebellum are normal. The cerebral hemispheres elsewhere show old infarction in the right posterior putaminal (acute in January of 2013). There are moderate chronic small vessel changes throughout the deep and subcortical white matter.  No cortical or large vessel territory infarction. No neoplastic mass lesion. No sign of acute hemorrhage. Minimal residual blood products in the old right basal ganglia infarction. No hydrocephalus. No extra-axial collection. No pituitary mass. There are mucosal inflammatory changes affecting the paranasal sinuses. No skull or skullbase lesion.  MRA HEAD FINDINGS  Both internal carotid arteries are widely  patent into the brain. No siphon stenosis. The anterior and middle cerebral vessels are patent without flow-limiting stenosis, aneurysm or vascular malformation. There may be very minimal irregularity of the M1 segments bilaterally.  Both vertebral arteries are patent with the left being dominant. No basilar stenosis. Posterior circulation branch vessels appear normal.  IMPRESSION: Acute infarction affecting the right putamen and the upper basal ganglia and adjacent radiating white matter tracts. No acute hemorrhage or swelling/ mass effect.  Old infarction in the right basal ganglia. Chronic small-vessel changes affecting the cerebral hemispheric white matter.  MR angiography of the large and medium size vessels is normal except for mild irregularity of the M1 segments without flow-limiting stenosis.   Electronically Signed   By: Paulina Fusi M.D.   On: 02/27/2013 14:01    EKG Interpretation     Ventricular Rate:  72 PR Interval:  164 QRS Duration: 92 QT Interval:  389 QTC Calculation: 426 R Axis:   35 Text Interpretation:  Sinus rhythm Abnormal ECG No significant change since last tracing            MDM   1. Acute CVA (cerebrovascular accident)   2. Diabetes 1.5, managed as type 2   3. Hypertension    Patient after having acute CVA. Nothing seen on CT. Dx'd on MRI. Pt woke up with symptoms and is outside therapeutic window. Discussed case with Dr. Karma Ganja who agrees with plan. Will admit patient to hospitalist with neuro consult for observation. Vital signs stable. Patient / Family /  Caregiver informed of clinical course, understand medical decision-making process, and agree with plan.   1:36 PM Discussed case with Dr. Leroy Kennedy who will come and evaluate patient.    Mora Bellman, PA-C 02/27/13 [phone]

## 2013-02-27 NOTE — ED Notes (Addendum)
Notified RN of CBG [id number],

## 2013-02-27 NOTE — ED Notes (Signed)
Lab at bedside

## 2013-02-27 NOTE — Consult Note (Signed)
Referring Physician: Linker    Chief Complaint: Left sided weakness and increased dysarthria  HPI:                                                                                                                                         Joe Boyle is an 47 y.o. male who had a very hard day yesterday and per wife was very upset.  Patient noted last night while in bed he was having some difficulty turning over in bed on the left side. This AM his left arm and speech continued to be off which prompted his wife to bring him to ED.  While in ED his BP has been well controlled.  In the past he was placed on ASA but was changed to Aggrenox in May due to TIA.  He was unable to tolerate Aggrenox (HA) and was switched to Plavix.  While in ED his MRI showed positive for right ischemic infarct. Neurology was consulted for further evaluation.   Date last known well: Date: 02/26/2013 Time last known well: Unable to determine tPA Given: No: non --out of window  Past Medical History  Diagnosis Date  . Hypertension   . Diabetes mellitus   . Stroke     History reviewed. No pertinent past surgical history.  No family history on file. Social History:  reports that he quit smoking about 13 years ago. He does not have any smokeless tobacco history on file. He reports that he drinks about 4.8 ounces of alcohol per week. He reports that he does not use illicit drugs.  Allergies:  Allergies  Allergen Reactions  . Penicillins Other (See Comments)    From when younger.  . Tylenol [Acetaminophen] Other (See Comments)    Childhood allergy    Medications:                                                                                                                           No current facility-administered medications for this encounter.   Current Outpatient Prescriptions  Medication Sig Dispense Refill  . clopidogrel (PLAVIX) 75 MG tablet Take 75 mg by mouth daily with breakfast.      . losartan (COZAAR) 50  MG tablet Take 50 mg by mouth daily.      . metFORMIN (GLUCOPHAGE-XR) 750 MG 24 hr tablet Take 750 mg by mouth 2 (  two) times daily.      . metoprolol succinate (TOPROL-XL) 50 MG 24 hr tablet Take 50 mg by mouth daily.      . pravastatin (PRAVACHOL) 40 MG tablet Take 40 mg by mouth daily.      . sitaGLIPtin (JANUVIA) 100 MG tablet Take 100 mg by mouth daily.         ROS:                                                                                                                                       History obtained from the patient  General ROS: negative for - chills, fatigue, fever, night sweats, weight gain or weight loss Psychological ROS: negative for - behavioral disorder, hallucinations, memory difficulties, mood swings or suicidal ideation Ophthalmic ROS: negative for - blurry vision, double vision, eye pain or loss of vision ENT ROS: negative for - epistaxis, nasal discharge, oral lesions, sore throat, tinnitus or vertigo Allergy and Immunology ROS: negative for - hives or itchy/watery eyes Hematological and Lymphatic ROS: negative for - bleeding problems, bruising or swollen lymph nodes Endocrine ROS: negative for - galactorrhea, hair pattern changes, polydipsia/polyuria or temperature intolerance Respiratory ROS: negative for - cough, hemoptysis, shortness of breath or wheezing Cardiovascular ROS: negative for - chest pain, dyspnea on exertion, edema or irregular heartbeat Gastrointestinal ROS: negative for - abdominal pain, diarrhea, hematemesis, nausea/vomiting or stool incontinence Genito-Urinary ROS: negative for - dysuria, hematuria, incontinence or urinary frequency/urgency Musculoskeletal ROS: negative for - joint swelling or muscular weakness Neurological ROS: as noted in HPI Dermatological ROS: negative for rash and skin lesion changes  Neurologic Examination:                                                                                                      Blood  pressure 142/77, pulse 66, temperature 98.6 F (37 C), temperature source Oral, resp. rate 12, SpO2 96.00%.   Mental Status: Alert, oriented, thought content appropriate.  Speech fluent without evidence of aphasia.  Able to follow 3 step commands without difficulty. Cranial Nerves: II: Discs flat bilaterally; Visual fields grossly normal, pupils equal, round, reactive to light and accommodation III,IV, VI: ptosis not present, extra-ocular motions intact bilaterally V,VII: smile asymmetric on the left and most notable when at rest with NL fold decrease, facial light touch sensation normal bilaterally VIII: hearing normal bilaterally IX,X: gag reflex present XI: bilateral shoulder shrug XII: midline tongue extension without atrophy or fasciculations  Motor: Right :  Upper extremity   5/5    Left:     Upper extremity   4+/5  Lower extremity   5/5     Lower extremity   4+/5 --left pronator drift Tone and bulk:normal tone throughout; no atrophy noted Sensory: Pinprick and light touch intact throughout, bilaterally Deep Tendon Reflexes:  Right: Upper Extremity   Left: Upper extremity   biceps (C-5 to C-6) 2/4   biceps (C-5 to C-6) 2/4 tricep (C7) 2/4    triceps (C7) 2/4 Brachioradialis (C6) 2/4  Brachioradialis (C6) 2/4  Lower Extremity Lower Extremity  quadriceps (L-2 to L-4) 2/4   quadriceps (L-2 to L-4) 2/4 Achilles (S1) 2/4   Achilles (S1) 2/4  Plantars: Right: downgoing   Left: downgoing Cerebellar: normal finger-to-nose,  normal heel-to-shin test Gait: not assessed CV: pulses palpable throughout    Results for orders placed during the hospital encounter of 02/27/13 (from the past 48 hour(s))  GLUCOSE, CAPILLARY     Status: Abnormal   Collection Time    02/27/13  9:20 AM      Result Value Range   Glucose-Capillary 149 (*) 70 - 99 mg/dL   Comment 1 Notify RN    TROPONIN I     Status: None   Collection Time    02/27/13 10:35 AM      Result Value Range   Troponin I <0.30   <0.30 ng/mL   Comment:            Due to the release kinetics of cTnI,     a negative result within the first hours     of the onset of symptoms does not rule out     myocardial infarction with certainty.     If myocardial infarction is still suspected,     repeat the test at appropriate intervals.  ETHANOL     Status: None   Collection Time    02/27/13 10:37 AM      Result Value Range   Alcohol, Ethyl (B) <11  0 - 11 mg/dL   Comment:            LOWEST DETECTABLE LIMIT FOR     SERUM ALCOHOL IS 11 mg/dL     FOR MEDICAL PURPOSES ONLY  PROTIME-INR     Status: None   Collection Time    02/27/13 10:37 AM      Result Value Range   Prothrombin Time 13.3  11.6 - 15.2 seconds   INR 1.03  0.00 - 1.49  APTT     Status: None   Collection Time    02/27/13 10:37 AM      Result Value Range   aPTT 29  24 - 37 seconds  CBC     Status: Abnormal   Collection Time    02/27/13 10:37 AM      Result Value Range   WBC 5.2  4.0 - 10.5 K/uL   RBC 3.95 (*) 4.22 - 5.81 MIL/uL   Hemoglobin 12.2 (*) 13.0 - 17.0 g/dL   HCT 16.1 (*) 09.6 - 04.5 %   MCV 85.8  78.0 - 100.0 fL   MCH 30.9  26.0 - 34.0 pg   MCHC 36.0  30.0 - 36.0 g/dL   RDW 40.9  81.1 - 91.4 %   Platelets 218  150 - 400 K/uL  DIFFERENTIAL     Status: Abnormal   Collection Time    02/27/13 10:37 AM      Result Value Range  Neutrophils Relative % 60  43 - 77 %   Neutro Abs 3.1  1.7 - 7.7 K/uL   Lymphocytes Relative 20  12 - 46 %   Lymphs Abs 1.0  0.7 - 4.0 K/uL   Monocytes Relative 9  3 - 12 %   Monocytes Absolute 0.5  0.1 - 1.0 K/uL   Eosinophils Relative 11 (*) 0 - 5 %   Eosinophils Absolute 0.6  0.0 - 0.7 K/uL   Basophils Relative 1  0 - 1 %   Basophils Absolute 0.0  0.0 - 0.1 K/uL  COMPREHENSIVE METABOLIC PANEL     Status: Abnormal   Collection Time    02/27/13 10:37 AM      Result Value Range   Sodium 131 (*) 135 - 145 mEq/L   Potassium 5.4 (*) 3.5 - 5.1 mEq/L   Chloride 96  96 - 112 mEq/L   CO2 25  19 - 32 mEq/L    Glucose, Bld 138 (*) 70 - 99 mg/dL   BUN 24 (*) 6 - 23 mg/dL   Creatinine, Ser 1.61  0.50 - 1.35 mg/dL   Calcium 9.3  8.4 - 09.6 mg/dL   Total Protein 7.2  6.0 - 8.3 g/dL   Albumin 4.1  3.5 - 5.2 g/dL   AST 22  0 - 37 U/L   ALT 22  0 - 53 U/L   Alkaline Phosphatase 54  39 - 117 U/L   Total Bilirubin 0.5  0.3 - 1.2 mg/dL   GFR calc non Af Amer >90  >90 mL/min   GFR calc Af Amer >90  >90 mL/min   Comment: (NOTE)     The eGFR has been calculated using the CKD EPI equation.     This calculation has not been validated in all clinical situations.     eGFR's persistently <90 mL/min signify possible Chronic Kidney     Disease.  POCT I-STAT TROPONIN I     Status: None   Collection Time    02/27/13 10:52 AM      Result Value Range   Troponin i, poc 0.01  0.00 - 0.08 ng/mL   Comment 3            Comment: Due to the release kinetics of cTnI,     a negative result within the first hours     of the onset of symptoms does not rule out     myocardial infarction with certainty.     If myocardial infarction is still suspected,     repeat the test at appropriate intervals.  POCT I-STAT, CHEM 8     Status: Abnormal   Collection Time    02/27/13 10:54 AM      Result Value Range   Sodium 132 (*) 135 - 145 mEq/L   Potassium 5.0  3.5 - 5.1 mEq/L   Chloride 98  96 - 112 mEq/L   BUN 25 (*) 6 - 23 mg/dL   Creatinine, Ser 0.45  0.50 - 1.35 mg/dL   Glucose, Bld 409 (*) 70 - 99 mg/dL   Calcium, Ion 8.11 (*) 1.12 - 1.23 mmol/L   TCO2 24  0 - 100 mmol/L   Hemoglobin 12.2 (*) 13.0 - 17.0 g/dL   HCT 91.4 (*) 78.2 - 95.6 %  URINE RAPID DRUG SCREEN (HOSP PERFORMED)     Status: None   Collection Time    02/27/13 11:20 AM      Result Value Range   Opiates  NONE DETECTED  NONE DETECTED   Cocaine NONE DETECTED  NONE DETECTED   Benzodiazepines NONE DETECTED  NONE DETECTED   Amphetamines NONE DETECTED  NONE DETECTED   Tetrahydrocannabinol NONE DETECTED  NONE DETECTED   Barbiturates NONE DETECTED  NONE  DETECTED   Comment:            DRUG SCREEN FOR MEDICAL PURPOSES     ONLY.  IF CONFIRMATION IS NEEDED     FOR ANY PURPOSE, NOTIFY LAB     WITHIN 5 DAYS.                LOWEST DETECTABLE LIMITS     FOR URINE DRUG SCREEN     Drug Class       Cutoff (ng/mL)     Amphetamine      1000     Barbiturate      200     Benzodiazepine   200     Tricyclics       300     Opiates          300     Cocaine          300     THC              50  URINALYSIS, ROUTINE W REFLEX MICROSCOPIC     Status: Abnormal   Collection Time    02/27/13 11:20 AM      Result Value Range   Color, Urine YELLOW  YELLOW   APPearance CLEAR  CLEAR   Specific Gravity, Urine 1.010  1.005 - 1.030   pH 5.0  5.0 - 8.0   Glucose, UA NEGATIVE  NEGATIVE mg/dL   Hgb urine dipstick NEGATIVE  NEGATIVE   Bilirubin Urine NEGATIVE  NEGATIVE   Ketones, ur NEGATIVE  NEGATIVE mg/dL   Protein, ur 30 (*) NEGATIVE mg/dL   Urobilinogen, UA 0.2  0.0 - 1.0 mg/dL   Nitrite NEGATIVE  NEGATIVE   Leukocytes, UA NEGATIVE  NEGATIVE  URINE MICROSCOPIC-ADD ON     Status: Abnormal   Collection Time    02/27/13 11:20 AM      Result Value Range   Squamous Epithelial / LPF FEW (*) RARE   WBC, UA 0-2  <3 WBC/hpf   Casts HYALINE CASTS (*) NEGATIVE   Ct Head Wo Contrast  02/27/2013   CLINICAL DATA:  Left-sided weakness  EXAM: CT HEAD WITHOUT CONTRAST  TECHNIQUE: Contiguous axial images were obtained from the base of the skull through the vertex without intravenous contrast.  COMPARISON:  05/02/2011  FINDINGS: Lacunar infarct in the right basal ganglia noted at the location of acute ischemia seen on the previous study. No evidence to suggest acute ischemia by CT on today's study. No acute hemorrhage, hydrocephalus, or mass lesion. No evidence for mass effect.  Scattered mucosal disease noted in the left frontal sinus and ethmoid air cells bilaterally.  IMPRESSION: No acute findings on today's exam.  Old lacunar infarct involving the right basal ganglia.    Electronically Signed   By: Kennith Center M.D.   On: 02/27/2013 11:33   Mr [name] Head Wo Contrast  02/27/2013   CLINICAL DATA:  Left facial droop. Slurred speech.  EXAM: MRI HEAD WITHOUT CONTRAST  MRA HEAD WITHOUT CONTRAST  TECHNIQUE: Multiplanar, multiecho pulse sequences of the brain and surrounding structures were obtained without intravenous contrast. Angiographic images of the head were obtained using MRA technique without contrast.  COMPARISON:  Head CT 02/27/2013. MRI  05/02/2011.  FINDINGS: MRI HEAD FINDINGS  Diffusion imaging shows acute infarction recurrent in the region of the right basal ganglia. There is a 3 mm acute infarction within the acute putamen. There is a 2 cm region of infarction involving the upper basal ganglia and radiating white matter tracts. These infarctions do not show hemorrhage or mass effect. No other areas of acute infarction are identified.  The brainstem and cerebellum are normal. The cerebral hemispheres elsewhere show old infarction in the right posterior putaminal (acute in January of 2013). There are moderate chronic small vessel changes throughout the deep and subcortical white matter. No cortical or large vessel territory infarction. No neoplastic mass lesion. No sign of acute hemorrhage. Minimal residual blood products in the old right basal ganglia infarction. No hydrocephalus. No extra-axial collection. No pituitary mass. There are mucosal inflammatory changes affecting the paranasal sinuses. No skull or skullbase lesion.  MRA HEAD FINDINGS  Both internal carotid arteries are widely patent into the brain. No siphon stenosis. The anterior and middle cerebral vessels are patent without flow-limiting stenosis, aneurysm or vascular malformation. There may be very minimal irregularity of the M1 segments bilaterally.  Both vertebral arteries are patent with the left being dominant. No basilar stenosis. Posterior circulation branch vessels appear normal.  IMPRESSION: Acute  infarction affecting the right putamen and the upper basal ganglia and adjacent radiating white matter tracts. No acute hemorrhage or swelling/ mass effect.  Old infarction in the right basal ganglia. Chronic small-vessel changes affecting the cerebral hemispheric white matter.  MR angiography of the large and medium size vessels is normal except for mild irregularity of the M1 segments without flow-limiting stenosis.   Electronically Signed   By: Paulina Fusi M.D.   On: 02/27/2013 14:01   Mr Brain Wo Contrast  02/27/2013   CLINICAL DATA:  Left facial droop. Slurred speech.  EXAM: MRI HEAD WITHOUT CONTRAST  MRA HEAD WITHOUT CONTRAST  TECHNIQUE: Multiplanar, multiecho pulse sequences of the brain and surrounding structures were obtained without intravenous contrast. Angiographic images of the head were obtained using MRA technique without contrast.  COMPARISON:  Head CT 02/27/2013. MRI 05/02/2011.  FINDINGS: MRI HEAD FINDINGS  Diffusion imaging shows acute infarction recurrent in the region of the right basal ganglia. There is a 3 mm acute infarction within the acute putamen. There is a 2 cm region of infarction involving the upper basal ganglia and radiating white matter tracts. These infarctions do not show hemorrhage or mass effect. No other areas of acute infarction are identified.  The brainstem and cerebellum are normal. The cerebral hemispheres elsewhere show old infarction in the right posterior putaminal (acute in January of 2013). There are moderate chronic small vessel changes throughout the deep and subcortical white matter. No cortical or large vessel territory infarction. No neoplastic mass lesion. No sign of acute hemorrhage. Minimal residual blood products in the old right basal ganglia infarction. No hydrocephalus. No extra-axial collection. No pituitary mass. There are mucosal inflammatory changes affecting the paranasal sinuses. No skull or skullbase lesion.  MRA HEAD FINDINGS  Both internal  carotid arteries are widely patent into the brain. No siphon stenosis. The anterior and middle cerebral vessels are patent without flow-limiting stenosis, aneurysm or vascular malformation. There may be very minimal irregularity of the M1 segments bilaterally.  Both vertebral arteries are patent with the left being dominant. No basilar stenosis. Posterior circulation branch vessels appear normal.  IMPRESSION: Acute infarction affecting the right putamen and the upper basal ganglia and adjacent  radiating white matter tracts. No acute hemorrhage or swelling/ mass effect.  Old infarction in the right basal ganglia. Chronic small-vessel changes affecting the cerebral hemispheric white matter.  MR angiography of the large and medium size vessels is normal except for mild irregularity of the M1 segments without flow-limiting stenosis.   Electronically Signed   By: Paulina Fusi M.D.   On: 02/27/2013 14:01    Assessment and plan discussed with with attending physician and they are in agreement.    Felicie Morn PA-C [facility] [phone]  02/27/2013, 2:35 PM   Assessment: 47 y.o. male with history of right IC stroke.  Patient had full stroke work up in the past with a negative hypercoagulable panel. Patient has been on ASA, changed to Aggrenox (gave him a sever HA) and now on Plavix.  While in hospital his BP has been well controlled and per wife his A1c and LDL has also bee under control. Currently he has minimal left arm and leg weakness  MRI brain shows infarction recurrent in the region of the right basal ganglia, putamen and a 2 cm region of infarction involving the upper basal ganglia and radiating white matter tracts.   Stroke Risk Factors - diabetes mellitus, hypertension and CVA  Recommend: 1) Hospitalist to admit for observation 2) Carotid Doppler 3) Echo 4) obtain A1c and LDL records from PCP 5) Continue Plavix daily    Patient seen and examined together with physician  assistant and I concur with the assessment and plan.  Wyatt Portela, MD

## 2013-02-27 NOTE — ED Notes (Signed)
PT reports he felt normal at bed time Sunday 02-26-13. Pt woke at 0700 and felt "A Little OFF" per PT . Pt was able to walk to BR but reported he had difficulty putting on his socks and toppled over to the floor.Pt then reported difficulty with using the key board . Pt denies HA or any vision changes.

## 2013-02-28 DIAGNOSIS — G8190 Hemiplegia, unspecified affecting unspecified side: Secondary | ICD-10-CM

## 2013-02-28 DIAGNOSIS — I517 Cardiomegaly: Secondary | ICD-10-CM

## 2013-02-28 DIAGNOSIS — E785 Hyperlipidemia, unspecified: Secondary | ICD-10-CM

## 2013-02-28 LAB — GLUCOSE, CAPILLARY: Glucose-Capillary: 136 mg/dL — ABNORMAL HIGH (ref 70–99)

## 2013-02-28 MED ORDER — ASPIRIN EC 325 MG PO TBEC: 325.0000 mg | DELAYED_RELEASE_TABLET | Freq: Every day | ORAL | Status: AC

## 2013-02-28 NOTE — Progress Notes (Signed)
PT Cancellation and Discharge Note  Patient Details Name: Joe Boyle MRN: [id number] DOB: 1966-02-26   Cancelled Treatment:    Reason Eval/Treat Not Completed: PT screened, no needs identified, will sign off.  Spoke with OT who states pt has returned to baseline.  No PT needs at this time.  Will sign off.     Sunny Schlein, [place] [phone] 02/28/2013, 8:51 AM

## 2013-02-28 NOTE — Progress Notes (Signed)
*  PRELIMINARY RESULTS* Vascular Ultrasound Carotid Duplex (Doppler) has been completed.  Findings suggest 1-39% internal carotid artery stenosis bilaterally. Vertebral arteries are patent with antegrade flow.  02/28/2013 2:13 PM Gertie Fey, RVT, RDCS, RDMS

## 2013-02-28 NOTE — Discharge Summary (Signed)
Physician Discharge Summary  Joe Boyle:[id number] DOB: 03/14/1966 DOA: 02/27/2013  PCP: Lenora Boys, MD  Admit date: 02/27/2013 Discharge date: 02/28/2013  Time spent: 35 minutes  Recommendations for Outpatient Follow-up:  Patiently to followup with his primary care physician, Dr. Nelda Severe, within one week of discharge. Patient instructed to take all his medications as prescribed.  Discharge Diagnoses:  Active Problems:   Hypertension   Acute CVA (cerebrovascular accident)   Diabetes 1.5, managed as type 2   Discharge Condition: Stable  Diet recommendation: Heart Healthy  Filed Weights   02/27/13 1900  Weight: 78.472 kg (173 lb)    History of present illness:  Joe Boyle is a 47 y.o. male with PMH of HTN, HPL, DM, h/o CVA (2013) presented with L sided arm weakness and aphasia since morning found to have new acute CVA after a stressful day yesterday; he denies, other focal neuro symptoms, no gait ataxia, no change in vision, or hearing;  -the past he was placed on ASA but was changed to Aggrenox in May due to TIA. He was unable to tolerate Aggrenox (HA) and was switched to Plavix   Hospital Course:  This is a 47 year old gentleman with a history of hypertension, hyperlipidemia, diabetes, history of CVA in January 2013 with residual left arm weakness that presented to the emergency department with left arm weakness and some aphasia. Patient does state on day of discharge that this has improved. Neurology was consulted. MRI of the brain did show acute infarction affecting the right putamen and the upper basal ganglia and adjacent radiating white matter tracts. Patient was continued on his Plavix. It seems he is taking Aggrenox in the past and did not tolerate this. Laboratory studies were ordered however patient refused these studies as he has had them in the past year at his primary care physician's office. She was noted to have a hemoglobin A1c of 6.1. His lipid panel noted at his  physician's office in July 2014 shows a total cholesterol 172, triglycerides 93, HDL 54, LDL of 111. Anticoagulation studies were ordered on this patient however again he refused. This should be discussed with his primary care physician. Echocardiogram as well as carotid Dopplers were also ordered, those results can be followed by his primary care physician. Patient refused physical therapy. Occupational therapy had no recommendations. Discussion with patient regarding stress level was conducted. Patient should seek medical depression his primary care physician, he should also seek stress relieving activities.  Procedures: Echocardiogram Carotid doppler  Consultations: Neurology  Discharge Exam: Filed Vitals:   02/28/13 0530  BP: 106/70  Pulse: 63  Temp: 97.9 F (36.6 C)  Resp: 18     General: Well developed, well nourished, NAD, appears stated age  HEENT: NCAT, PERRLA, EOMI, Anicteic Sclera, mucous membranes moist. No pharyngeal erythema or exudates  Neck: Supple, no JVD, no masses  Cardiovascular: S1 S2 auscultated, no rubs, murmurs or gallops. Regular rate and rhythm.  Respiratory: Clear to auscultation bilaterally with equal chest rise  Abdomen: Soft, nontender, nondistended, + bowel sounds  Extremities: warm dry without cyanosis clubbing or edema  Neuro: AAOx3, cranial nerves grossly intact. Strength 5/5 in patient's RUE, RLL, LLE.  Strength 4/5 in LUE.  Skin: Without rashes exudates or nodules  Psych: Normal affect and demeanor with intact judgement and insight, anxious  Discharge Instructions     Medication List    ASK your doctor about these medications       clopidogrel 75 MG tablet  Commonly known  as:  PLAVIX  Take 75 mg by mouth daily with breakfast.     losartan 50 MG tablet  Commonly known as:  COZAAR  Take 50 mg by mouth daily.     metFORMIN 750 MG 24 hr tablet  Commonly known as:  GLUCOPHAGE-XR  Take 750 mg by mouth 2 (two) times daily.      metoprolol succinate 50 MG 24 hr tablet  Commonly known as:  TOPROL-XL  Take 50 mg by mouth daily.     pravastatin 40 MG tablet  Commonly known as:  PRAVACHOL  Take 40 mg by mouth daily.     sitaGLIPtin 100 MG tablet  Commonly known as:  JANUVIA  Take 100 mg by mouth daily.       Allergies  Allergen Reactions  . Penicillins Other (See Comments)    From when younger.  . Tylenol [Acetaminophen] Other (See Comments)    Childhood allergy      The results of significant diagnostics from this hospitalization (including imaging, microbiology, ancillary and laboratory) are listed below for reference.    Significant Diagnostic Studies: Ct Head Wo Contrast  02/27/2013   CLINICAL DATA:  Left-sided weakness  EXAM: CT HEAD WITHOUT CONTRAST  TECHNIQUE: Contiguous axial images were obtained from the base of the skull through the vertex without intravenous contrast.  COMPARISON:  05/02/2011  FINDINGS: Lacunar infarct in the right basal ganglia noted at the location of acute ischemia seen on the previous study. No evidence to suggest acute ischemia by CT on today's study. No acute hemorrhage, hydrocephalus, or mass lesion. No evidence for mass effect.  Scattered mucosal disease noted in the left frontal sinus and ethmoid air cells bilaterally.  IMPRESSION: No acute findings on today's exam.  Old lacunar infarct involving the right basal ganglia.   Electronically Signed   By: Kennith Center M.D.   On: 02/27/2013 11:33   Mr [name] Head Wo Contrast  02/27/2013   CLINICAL DATA:  Left facial droop. Slurred speech.  EXAM: MRI HEAD WITHOUT CONTRAST  MRA HEAD WITHOUT CONTRAST  TECHNIQUE: Multiplanar, multiecho pulse sequences of the brain and surrounding structures were obtained without intravenous contrast. Angiographic images of the head were obtained using MRA technique without contrast.  COMPARISON:  Head CT 02/27/2013. MRI 05/02/2011.  FINDINGS: MRI HEAD FINDINGS  Diffusion imaging shows acute infarction  recurrent in the region of the right basal ganglia. There is a 3 mm acute infarction within the acute putamen. There is a 2 cm region of infarction involving the upper basal ganglia and radiating white matter tracts. These infarctions do not show hemorrhage or mass effect. No other areas of acute infarction are identified.  The brainstem and cerebellum are normal. The cerebral hemispheres elsewhere show old infarction in the right posterior putaminal (acute in January of 2013). There are moderate chronic small vessel changes throughout the deep and subcortical white matter. No cortical or large vessel territory infarction. No neoplastic mass lesion. No sign of acute hemorrhage. Minimal residual blood products in the old right basal ganglia infarction. No hydrocephalus. No extra-axial collection. No pituitary mass. There are mucosal inflammatory changes affecting the paranasal sinuses. No skull or skullbase lesion.  MRA HEAD FINDINGS  Both internal carotid arteries are widely patent into the brain. No siphon stenosis. The anterior and middle cerebral vessels are patent without flow-limiting stenosis, aneurysm or vascular malformation. There may be very minimal irregularity of the M1 segments bilaterally.  Both vertebral arteries are patent with the left being dominant.  No basilar stenosis. Posterior circulation branch vessels appear normal.  IMPRESSION: Acute infarction affecting the right putamen and the upper basal ganglia and adjacent radiating white matter tracts. No acute hemorrhage or swelling/ mass effect.  Old infarction in the right basal ganglia. Chronic small-vessel changes affecting the cerebral hemispheric white matter.  MR angiography of the large and medium size vessels is normal except for mild irregularity of the M1 segments without flow-limiting stenosis.   Electronically Signed   By: Paulina Fusi M.D.   On: 02/27/2013 14:01   Mr Brain Wo Contrast  02/27/2013   CLINICAL DATA:  Left facial droop.  Slurred speech.  EXAM: MRI HEAD WITHOUT CONTRAST  MRA HEAD WITHOUT CONTRAST  TECHNIQUE: Multiplanar, multiecho pulse sequences of the brain and surrounding structures were obtained without intravenous contrast. Angiographic images of the head were obtained using MRA technique without contrast.  COMPARISON:  Head CT 02/27/2013. MRI 05/02/2011.  FINDINGS: MRI HEAD FINDINGS  Diffusion imaging shows acute infarction recurrent in the region of the right basal ganglia. There is a 3 mm acute infarction within the acute putamen. There is a 2 cm region of infarction involving the upper basal ganglia and radiating white matter tracts. These infarctions do not show hemorrhage or mass effect. No other areas of acute infarction are identified.  The brainstem and cerebellum are normal. The cerebral hemispheres elsewhere show old infarction in the right posterior putaminal (acute in January of 2013). There are moderate chronic small vessel changes throughout the deep and subcortical white matter. No cortical or large vessel territory infarction. No neoplastic mass lesion. No sign of acute hemorrhage. Minimal residual blood products in the old right basal ganglia infarction. No hydrocephalus. No extra-axial collection. No pituitary mass. There are mucosal inflammatory changes affecting the paranasal sinuses. No skull or skullbase lesion.  MRA HEAD FINDINGS  Both internal carotid arteries are widely patent into the brain. No siphon stenosis. The anterior and middle cerebral vessels are patent without flow-limiting stenosis, aneurysm or vascular malformation. There may be very minimal irregularity of the M1 segments bilaterally.  Both vertebral arteries are patent with the left being dominant. No basilar stenosis. Posterior circulation branch vessels appear normal.  IMPRESSION: Acute infarction affecting the right putamen and the upper basal ganglia and adjacent radiating white matter tracts. No acute hemorrhage or swelling/ mass  effect.  Old infarction in the right basal ganglia. Chronic small-vessel changes affecting the cerebral hemispheric white matter.  MR angiography of the large and medium size vessels is normal except for mild irregularity of the M1 segments without flow-limiting stenosis.   Electronically Signed   By: Paulina Fusi M.D.   On: 02/27/2013 14:01    Microbiology: No results found for this or any previous visit (from the past 240 hour(s)).   Labs: Basic Metabolic Panel:  Recent Labs Lab 02/27/13 1037 02/27/13 1054  NA 131* 132*  K 5.4* 5.0  CL 96 98  CO2 25  --   GLUCOSE 138* 140*  BUN 24* 25*  CREATININE 0.96 1.20  CALCIUM 9.3  --    Liver Function Tests:  Recent Labs Lab 02/27/13 1037  AST 22  ALT 22  ALKPHOS 54  BILITOT 0.5  PROT 7.2  ALBUMIN 4.1   No results found for this basename: LIPASE, AMYLASE,  in the last 168 hours No results found for this basename: AMMONIA,  in the last 168 hours CBC:  Recent Labs Lab 02/27/13 1037 02/27/13 1054  WBC 5.2  --  NEUTROABS 3.1  --   HGB 12.2* 12.2*  HCT 33.9* 36.0*  MCV 85.8  --   PLT 218  --    Cardiac Enzymes:  Recent Labs Lab 02/27/13 1035  TROPONINI <0.30   BNP: BNP (last 3 results) No results found for this basename: PROBNP,  in the last 8760 hours CBG:  Recent Labs Lab 02/27/13 0920 02/27/13 1710 02/28/13 0638  GLUCAP 149* 127* 136*       Signed:  Cortina Vultaggio  [facility] 02/28/2013, 9:32 AM

## 2013-02-28 NOTE — Evaluation (Signed)
Occupational Therapy Evaluation Patient Details Name: Joe Boyle MRN: [id number] DOB: 1965-07-11 Today's Date: 02/28/2013 Time: 4098-1191 OT Time Calculation (min): 9 min  OT Assessment / Plan / Recommendation History of present illness  47 y.o. male admitted with Lt side weakness and aphasia. Pt with Hx of HTN, DM and CVA (2013). MRI (+) Acute infarction affecting the right putamen and the upper basal ganglia and adjacent radiating white matter tracts    Clinical Impression   Patient evaluated by Occupational Therapy with no further acute OT needs identified. All education has been completed and the patient has no further questions. See below for any follow-up Occupational Therapy or equipment needs. OT to sign off. Thank you for referral.    OT Assessment  Patient does not need any further OT services    Follow Up Recommendations  No OT follow up    Barriers to Discharge      Equipment Recommendations  None recommended by OT    Recommendations for Other Services    Frequency       Precautions / Restrictions Precautions Precautions: None   Pertinent Vitals/Pain No pain reported Reports numbness on Rt side    ADL  ADL Comments: pt with wife and daughter in room on arrival. pt sitting in chair working on computer with rt ue. pt reports decr coordination of Lt Ue. 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 pt demonstrates decr speed with Lt UE. pt reports being at baseline. pts gross strength WFL on left UE. pt reports being in hallway walking and dancing. Wife reports he is near baseline. PT without acute needs. Pt has theraputty and therabands at home. pt does not require any equipment or exercises due to good recall of previous session. Pt declines outpatient referral. OT to sign off acute.    OT Diagnosis:    OT Problem List:   OT Treatment Interventions:     OT Goals(Current goals can be found in the care plan section)    Visit Information  Last OT Received On:  02/28/13 Assistance Needed: +1 History of Present Illness:  47 y.o. male admitted with Lt side weakness and aphasia. Pt with Hx of HTN, DM and CVA (2013). MRI (+) Acute infarction affecting the right putamen and the upper basal ganglia and adjacent radiating white matter tracts        Prior Functioning     Home Living Family/patient expects to be discharged to:: Private residence Available Help at Discharge: Family Type of Home: House Home Access: Stairs to enter Secretary/administrator of Steps: 3 Entrance Stairs-Rails: Right Home Layout: One level Home Equipment: None Prior Function Level of Independence: Independent Communication Communication: No difficulties Dominant Hand: Right         Vision/Perception Vision - History Baseline Vision: No visual deficits Patient Visual Report: No change from baseline   Cognition  Cognition Arousal/Alertness: Awake/alert Behavior During Therapy: WFL for tasks assessed/performed Overall Cognitive Status: Within Functional Limits for tasks assessed    Extremity/Trunk Assessment Upper Extremity Assessment Upper Extremity Assessment: LUE deficits/detail LUE Deficits / Details: decr coordination, strength WFL, pt WFL ROM LUE Sensation: decreased light touch LUE Coordination:  (able to complete pad to pad) Lower Extremity Assessment Lower Extremity Assessment: Defer to PT evaluation Cervical / Trunk Assessment Cervical / Trunk Assessment: Normal     Mobility Bed Mobility Bed Mobility: Not assessed Transfers Transfers: Sit to Stand;Stand to Sit Sit to Stand: 7: Independent;From chair/3-in-1 Stand to Sit: 7: Independent;Other (comment) (  w/c) Details for Transfer Assistance: observed pt transfer with transport     Exercise     Balance     End of Session OT - End of Session Activity Tolerance: Patient tolerated treatment well Patient left: with family/visitor present;Other (comment) (transport to test)  GO     Boone Master B 02/28/2013, 8:39 AM Pager: [phone]

## 2013-03-03 NOTE — ED Provider Notes (Signed)
Medical screening examination/treatment/procedure(s) were performed by non-physician practitioner and as supervising physician I was immediately available for consultation/collaboration.  EKG Interpretation     Ventricular Rate:  72 PR Interval:  164 QRS Duration: 92 QT Interval:  389 QTC Calculation: 426 R Axis:   35 Text Interpretation:  Sinus rhythm Abnormal ECG No significant change since last tracing             Ethelda Chick, MD 03/03/13 1714

## 2014-02-12 ENCOUNTER — Encounter: Payer: Self-pay | Admitting: Diagnostic Neuroimaging

## 2014-02-12 ENCOUNTER — Ambulatory Visit (INDEPENDENT_AMBULATORY_CARE_PROVIDER_SITE_OTHER): Payer: 59 | Admitting: Diagnostic Neuroimaging

## 2014-02-12 VITALS — BP 172/83 | HR 73 | Temp 98.1°F | Resp 20 | Ht 67.0 in | Wt 177.0 lb

## 2014-02-12 DIAGNOSIS — E1142 Type 2 diabetes mellitus with diabetic polyneuropathy: Secondary | ICD-10-CM

## 2014-02-12 DIAGNOSIS — I63311 Cerebral infarction due to thrombosis of right middle cerebral artery: Secondary | ICD-10-CM

## 2014-02-12 DIAGNOSIS — M79602 Pain in left arm: Secondary | ICD-10-CM

## 2014-02-12 NOTE — Patient Instructions (Addendum)
I will check EMG (nerve test).  Continue range of motion exercises.

## 2014-02-12 NOTE — Progress Notes (Signed)
[facility]  PATIENT: Joe Boyle DOB: 1966/04/12  REFERRING CLINICIAN: Maceo Pro HISTORY FROM: patient  REASON FOR VISIT: new consult    HISTORICAL  CHIEF COMPLAINT:  Chief Complaint  Patient presents with  . Establish Care    HISTORY OF PRESENT ILLNESS:   48 year old right-handed male with hypertension, diabetes, hypercholesterolemia, multiple strokes, here for evaluation of left upper extremity pain and weakness.  January 2013 patient had sudden onset left arm, face, greater than leg weakness and numbness. Patient was diagnosed with stroke and made almost complete recovery within 1 year. Patient was not on any medications at the time of stroke and was discharged on blood pressure, diabetes, lipid, antiplatelet medication (Plavix).  November 2014 patient had left arm greater than face and leg numbness and weakness, and was diagnosed with another stroke. Patient was switched from Plavix to aspirin. He was previously intolerant of Aggrenox. Patient had some improvement following this hospital stay.  August 2015 patient had gradual onset of left shoulder, left arm, left wrist aching muscular pain. Some numbness in his left hand was noted. Patient presents for further evaluation of this left arm pain.  Patient reports history of diabetic neuropathy diagnosis, based on EMG from [place] several years ago. Patient is also noted muscle atrophy in his bilateral hands. This has developed over several years.  Patient also reports some neck pain, without radiation down the arms.    REVIEW OF SYSTEMS: Full 14 system review of systems performed and notable only for joint pain cramps aching muscle aches numbness weakness.  ALLERGIES: Allergies  Allergen Reactions  . Penicillins Other (See Comments)    From when younger.  . Tylenol [Acetaminophen] Other (See Comments)    Childhood allergy    HOME MEDICATIONS: Outpatient Prescriptions Prior to Visit  Medication  Sig Dispense Refill  . aspirin EC 325 MG tablet Take 1 tablet (325 mg total) by mouth daily.  30 tablet  0  . metFORMIN (GLUCOPHAGE-XR) 750 MG 24 hr tablet Take 750 mg by mouth 2 (two) times daily.      . metoprolol succinate (TOPROL-XL) 50 MG 24 hr tablet Take 50 mg by mouth daily.      Marland Kitchen losartan (COZAAR) 50 MG tablet Take 50 mg by mouth daily.      . pravastatin (PRAVACHOL) 40 MG tablet Take 40 mg by mouth daily.      . sitaGLIPtin (JANUVIA) 100 MG tablet Take 100 mg by mouth daily.       No facility-administered medications prior to visit.    PAST MEDICAL HISTORY: Past Medical History  Diagnosis Date  . Hypertension   . Diabetes mellitus   . Stroke     PAST SURGICAL HISTORY: No past surgical history on file.  FAMILY HISTORY: No family history on file.  SOCIAL HISTORY:  History   Social History  . Marital Status: Married    Spouse Name: N/A    Number of Children: N/A  . Years of Education: N/A   Occupational History  . Not on file.   Social History Main Topics  . Smoking status: Former Smoker -- 1.00 packs/day    Quit date: 05/02/1999  . Smokeless tobacco: Not on file  . Alcohol Use: 4.8 oz/week    8 Cans of beer per week  . Drug Use: No  . Sexual Activity:    Other Topics Concern  . Not on file   Social History Narrative  . No narrative on file     PHYSICAL  EXAM  Filed Vitals:   02/12/14 1009  BP: 172/83  Pulse: 73  Temp: 98.1 F (36.7 C)  TempSrc: Oral  Resp: 20  Height: 5\' 7"  (1.702 m)  Weight: 177 lb (80.287 kg)    Not recorded    Visual Acuity Screening   Right eye Left eye Both eyes  Without correction:   20/100  With correction:        Body mass index is 27.72 kg/(m^2).  GENERAL EXAM: Patient is in no distress; well developed, nourished and groomed; neck is supple  CARDIOVASCULAR: Regular rate and rhythm, no murmurs, no carotid bruits  NEUROLOGIC: MENTAL STATUS: awake, alert, oriented to person, place and time, recent and  remote memory intact, normal attention and concentration, language fluent, comprehension intact, naming intact, fund of knowledge appropriate CRANIAL NERVE: no papilledema on fundoscopic exam, pupils equal and reactive to light, visual fields full to confrontation, extraocular muscles intact, no nystagmus, facial sensation and strength symmetric, hearing intact, palate elevates symmetrically, uvula midline, shoulder shrug symmetric, tongue midline. MOTOR: normal bulk and tone, full strength in the RUE (EXCEPT ATROPHY AND WEAKNESS OF BILATERAL HAND INTRINSIC MUSCLES 3), FULL STRENGTH IN BLE; LUE (DECR ROM AT SHOULDER AND FOREARM SUPINATION; DELTOID 3, BICEP 4, TRICEP 3, GRIP 3, FINGER ADDUCTION 3).  SENSORY: normal and symmetric to light touch, temperature; ABSENT VIB AT TOES COORDINATION: finger-nose-finger, fine finger movements normal REFLEXES: deep tendon reflexes present and symmetric GAIT/STATION: narrow based gait; able to walk on toes, heels and tandem; romberg is negative    DIAGNOSTIC DATA (LABS, IMAGING, TESTING) - I reviewed patient records, labs, notes, testing and imaging myself where available.  Lab Results  Component Value Date   WBC 5.2 02/27/2013   HGB 12.2* 02/27/2013   HCT 36.0* 02/27/2013   MCV 85.8 02/27/2013   PLT 218 02/27/2013      Component Value Date/Time   NA 132* 02/27/2013 1054   K 5.0 02/27/2013 1054   CL 98 02/27/2013 1054   CO2 25 02/27/2013 1037   GLUCOSE 140* 02/27/2013 1054   BUN 25* 02/27/2013 1054   CREATININE 1.20 02/27/2013 1054   CALCIUM 9.3 02/27/2013 1037   PROT 7.2 02/27/2013 1037   ALBUMIN 4.1 02/27/2013 1037   AST 22 02/27/2013 1037   ALT 22 02/27/2013 1037   ALKPHOS 54 02/27/2013 1037   BILITOT 0.5 02/27/2013 1037   GFRNONAA >90 02/27/2013 1037   GFRAA >90 02/27/2013 1037   Lab Results  Component Value Date   CHOL 269* 05/02/2011   HDL 49 05/02/2011   LDLCALC 194* 05/02/2011   TRIG 128 05/02/2011   CHOLHDL 5.5 05/02/2011   Lab  Results  Component Value Date   HGBA1C 6.1* 02/27/2013   No results found for this basename: VITAMINB12   Lab Results  Component Value Date   TSH 2.189 05/03/2011    I reviewed images myself and agree with interpretation. -VRP  02/27/13 MRI brain - Acute infarction affecting the right putamen and the upper basal ganglia and adjacent radiating white matter tracts. No acute hemorrhage or swelling/ mass effect. Old infarction in the right basal ganglia. Chronic small-vessel changes affecting the cerebral hemispheric white matter.   02/27/13 MRA head - normal except for mild irregularity of the M1 segments without flow-limiting stenosis.  02/27/13 TTE - Left ventricle: The cavity size was normal. There was mild concentric hypertrophy. Systolic function was normal. The estimated ejection fraction was in the range of 60% to 65%. Doppler parameters are consistent with  abnormal left ventricular relaxation (grade 1 diastolic dysfunction). The E/e' ratio is <10, suggesting normal LV filling pressure. - Mitral valve: Calcified annulus. Trivial regurgitation. - Left atrium: The atrium was mildly dilated (27.4 ml/m2). - Right atrium: The atrium was mildly dilated. - Inferior vena cava: The vessel was normal in size; the respirophasic diameter changes were in the normal range (=50%); findings are consistent with normal central venous pressure. - Pericardium, extracardiac: There was no pericardial effusion.   05/02/11 MRI brain - Acute right basal ganglia and adjacent white matter lacunar infarct. No mass effect or hemorrhage.   05/02/11 MRA head - Minimal to mild intracranial atherosclerosis. No intracranial stenosis or major branch occlusion.  05/02/11 TTE - No cardiac source of emboli was indentified.  05/03/11 carotid u/s - Right: mild heterogeneous plaque origin ICA. Left: Mild calcific plaque origin ICA. Mild to moderate plaque distal CCA. Bilateral: no significant ICA stenosis. Vertebral artery  flow is antegrade.    ASSESSMENT AND PLAN  48 y.o. year old male here with 2 right brain subcortical strokes (Jan 2013, Nov 2014), now with left arm pain since Aug 2015. Will check EMG. Then may consider MRI cervical spine, gabapentin, and/or PM&R evaluation.  Ddx: post-stroke pain, left cervical radiculopathy, peripheral neuropathy, musculoskeletal strain, myofascial pain   PLAN:  Orders Placed This Encounter  Procedures  . NCV with EMG(electromyography)   Return for for NCV/EMG.    Penni Bombard, MD 59/29/2446, 28:63 AM Certified in Neurology, Neurophysiology and Neuroimaging  [facility] [address] [place], [place] [address] [phone]

## 2014-03-05 ENCOUNTER — Encounter: Payer: 59 | Admitting: Diagnostic Neuroimaging

## 2016-01-29 DIAGNOSIS — E113593 Type 2 diabetes mellitus with proliferative diabetic retinopathy without macular edema, bilateral: Secondary | ICD-10-CM | POA: Diagnosis not present

## 2016-01-29 DIAGNOSIS — H3582 Retinal ischemia: Secondary | ICD-10-CM | POA: Diagnosis not present

## 2016-01-29 DIAGNOSIS — H211X3 Other vascular disorders of iris and ciliary body, bilateral: Secondary | ICD-10-CM | POA: Diagnosis not present

## 2016-02-05 DIAGNOSIS — E113592 Type 2 diabetes mellitus with proliferative diabetic retinopathy without macular edema, left eye: Secondary | ICD-10-CM | POA: Diagnosis not present

## 2016-02-26 DIAGNOSIS — E113591 Type 2 diabetes mellitus with proliferative diabetic retinopathy without macular edema, right eye: Secondary | ICD-10-CM | POA: Diagnosis not present

## 2016-04-27 DIAGNOSIS — R05 Cough: Secondary | ICD-10-CM | POA: Diagnosis not present

## 2016-04-27 DIAGNOSIS — M549 Dorsalgia, unspecified: Secondary | ICD-10-CM | POA: Diagnosis not present

## 2016-06-23 DIAGNOSIS — E1129 Type 2 diabetes mellitus with other diabetic kidney complication: Secondary | ICD-10-CM | POA: Diagnosis not present

## 2016-06-23 DIAGNOSIS — I10 Essential (primary) hypertension: Secondary | ICD-10-CM | POA: Diagnosis not present

## 2016-06-23 DIAGNOSIS — R809 Proteinuria, unspecified: Secondary | ICD-10-CM | POA: Diagnosis not present

## 2016-07-15 DIAGNOSIS — H211X3 Other vascular disorders of iris and ciliary body, bilateral: Secondary | ICD-10-CM | POA: Diagnosis not present

## 2016-07-15 DIAGNOSIS — E113593 Type 2 diabetes mellitus with proliferative diabetic retinopathy without macular edema, bilateral: Secondary | ICD-10-CM | POA: Diagnosis not present

## 2016-07-15 DIAGNOSIS — H3582 Retinal ischemia: Secondary | ICD-10-CM | POA: Diagnosis not present

## 2016-12-30 DIAGNOSIS — H3582 Retinal ischemia: Secondary | ICD-10-CM | POA: Diagnosis not present

## 2016-12-30 DIAGNOSIS — E113593 Type 2 diabetes mellitus with proliferative diabetic retinopathy without macular edema, bilateral: Secondary | ICD-10-CM | POA: Diagnosis not present

## 2016-12-30 DIAGNOSIS — H211X3 Other vascular disorders of iris and ciliary body, bilateral: Secondary | ICD-10-CM | POA: Diagnosis not present

## 2017-01-01 ENCOUNTER — Encounter (HOSPITAL_BASED_OUTPATIENT_CLINIC_OR_DEPARTMENT_OTHER): Payer: Self-pay

## 2017-01-01 ENCOUNTER — Emergency Department (HOSPITAL_BASED_OUTPATIENT_CLINIC_OR_DEPARTMENT_OTHER)
Admission: EM | Admit: 2017-01-01 | Discharge: 2017-01-01 | Disposition: A | Discharge status: Home / Self Care | Payer: BLUE CROSS/BLUE SHIELD | Attending: Emergency Medicine | Admitting: Emergency Medicine

## 2017-01-01 DIAGNOSIS — Z7982 Long term (current) use of aspirin: Secondary | ICD-10-CM | POA: Diagnosis not present

## 2017-01-01 DIAGNOSIS — Z87891 Personal history of nicotine dependence: Secondary | ICD-10-CM | POA: Diagnosis not present

## 2017-01-01 DIAGNOSIS — R21 Rash and other nonspecific skin eruption: Secondary | ICD-10-CM | POA: Diagnosis not present

## 2017-01-01 DIAGNOSIS — I10 Essential (primary) hypertension: Secondary | ICD-10-CM | POA: Insufficient documentation

## 2017-01-01 DIAGNOSIS — Z8673 Personal history of transient ischemic attack (TIA), and cerebral infarction without residual deficits: Secondary | ICD-10-CM | POA: Insufficient documentation

## 2017-01-01 DIAGNOSIS — E119 Type 2 diabetes mellitus without complications: Secondary | ICD-10-CM | POA: Insufficient documentation

## 2017-01-01 DIAGNOSIS — Z7984 Long term (current) use of oral hypoglycemic drugs: Secondary | ICD-10-CM | POA: Diagnosis not present

## 2017-01-01 LAB — CBC WITH DIFFERENTIAL/PLATELET
BASOS ABS: 0 10*3/uL (ref 0.0–0.1)
Basophils Relative: 0 %
EOS PCT: 6 %
Eosinophils Absolute: 0.3 10*3/uL (ref 0.0–0.7)
HCT: 35.5 % — ABNORMAL LOW (ref 39.0–52.0)
Hemoglobin: 12.3 g/dL — ABNORMAL LOW (ref 13.0–17.0)
Lymphocytes Relative: 14 %
Lymphs Abs: 0.8 10*3/uL (ref 0.7–4.0)
MCH: 30.8 pg (ref 26.0–34.0)
MCHC: 34.6 g/dL (ref 30.0–36.0)
MCV: 88.8 fL (ref 78.0–100.0)
Monocytes Absolute: 0.5 10*3/uL (ref 0.1–1.0)
Monocytes Relative: 10 %
Neutro Abs: 3.9 10*3/uL (ref 1.7–7.7)
Neutrophils Relative %: 70 %
PLATELETS: 167 10*3/uL (ref 150–400)
RBC: 4 MIL/uL — ABNORMAL LOW (ref 4.22–5.81)
RDW: 12 % (ref 11.5–15.5)
WBC: 5.6 10*3/uL (ref 4.0–10.5)

## 2017-01-01 LAB — COMPREHENSIVE METABOLIC PANEL
ALT: 35 U/L (ref 17–63)
AST: 31 U/L (ref 15–41)
Albumin: 4.3 g/dL (ref 3.5–5.0)
Alkaline Phosphatase: 71 U/L (ref 38–126)
Anion gap: 7 (ref 5–15)
BUN: 16 mg/dL (ref 6–20)
CO2: 28 mmol/L (ref 22–32)
CREATININE: 0.95 mg/dL (ref 0.61–1.24)
Calcium: 9.4 mg/dL (ref 8.9–10.3)
Chloride: 96 mmol/L — ABNORMAL LOW (ref 101–111)
GFR calc Af Amer: >60 mL/min (ref >60)
Glucose, Bld: 192 mg/dL — ABNORMAL HIGH (ref 65–99)
Potassium: 4.6 mmol/L (ref 3.5–5.1)
SODIUM: 131 mmol/L — AB (ref 135–145)
TOTAL PROTEIN: 7.4 g/dL (ref 6.5–8.1)
Total Bilirubin: 0.5 mg/dL (ref 0.3–1.2)

## 2017-01-01 MED ORDER — HYDROXYZINE HCL 50 MG/ML IM SOLN
50.0000 mg | Freq: Four times a day (QID) | INTRAMUSCULAR | Status: DC | PRN
  Filled 2017-01-01: qty 1

## 2017-01-01 MED ORDER — DOXYCYCLINE HYCLATE 100 MG PO CAPS: 200.0000 mg | ORAL_CAPSULE | Freq: Two times a day (BID) | ORAL | 0 refills | Status: AC

## 2017-01-01 MED ORDER — SODIUM CHLORIDE 0.9 % IV BOLUS (SEPSIS): 1000.0000 mL | Freq: Once | INTRAVENOUS | Status: DC

## 2017-01-01 NOTE — Discharge Instructions (Signed)
You were seen in the ED for rash, fever, chills, headache, body aches, joint pain.   Given your history, we have considered tick borne rash, meningitis rash, and venom rash. Less likely, syphilis rash or drug induced rash.   Your RMSF and Lyme titers are pending. Please follow up with your primary care provider in 3 days for further assessment of your rash.   Take doxycyline, which is antibiotic for RMSF. This medication should be taken for 3 days after day of fever.

## 2017-01-01 NOTE — ED Triage Notes (Signed)
C/o rash, muscles aches, back pain, HA, fever last night-sent from PCP office-NAD-steady gait

## 2017-01-01 NOTE — ED Notes (Signed)
Pt concerned about POC. PA notified at this time.

## 2017-01-01 NOTE — ED Provider Notes (Signed)
[facility] Provider Note   CSN: [id number] Arrival date & time: 01/01/17  1543     History   Chief Complaint Chief Complaint  Patient presents with  . Rash    HPI Joe Boyle is a 51 y.o. male with history of diabetes on metformin, CVA, hypertension, HLD presents to the ED for evaluation of mildly pruritic red rash that started on trunk and has been spreading to upper and lower extremities since yesterday. Associated symptoms include intermittent headache, subjective fevers, chills, body aches, arthralgias which were present last night only and have since resolved. Zyrtec and calamine lotion have not helped. [place] recent [place]. He returned home yesterday and was cutting down a tree at his home. States he knows that they're used to be black widow spiders in this tree but did not see any yesterday and did not feel any specific bites. He denies preceding URI or GI illnesses. No new medications or antibiotics. No new topical lotions, perfumes, detergents. No new exposure to animals. He has not found a removed a tick from his body recently. He is in a long term relationship with his partner, denies recent new sexual partners.No sore throat, cough, visual changes, neck pain or stiffness, abdominal pain, n/v/d/c, photophobia. HPI  Past Medical History:  Diagnosis Date  . Diabetes mellitus   . Hypertension   . Stroke [facility])     Patient Active Problem List   Diagnosis Date Noted  . Acute CVA (cerebrovascular accident) ([place]) 02/27/2013  . Diabetes 1.5, managed as type 2 ([place]) 02/27/2013  . Left hemiparesis ([place]) 05/04/2011  . Hyperlipidemia 05/03/2011  . CVA (cerebral infarction) 05/02/2011  . Hypertension 05/02/2011  . Diabetes mellitus 05/02/2011    History reviewed. No pertinent surgical history.     Home Medications    Prior to Admission medications   Medication Sig Start Date End Date Taking?  Authorizing Provider  QUINAPRIL HCL PO Take by mouth.   Yes [provider]  rosuvastatin (CRESTOR) 20 MG tablet Take 20 mg by mouth daily.   Yes [provider]  aspirin EC 325 MG tablet Take 1 tablet (325 mg total) by mouth daily. 02/28/13   Mikhail, Velta Addison, DO  doxycycline (VIBRAMYCIN) 100 MG capsule Take 2 capsules (200 mg total) by mouth 2 (two) times daily. 01/01/17 01/04/17  Kinnie Feil, PA-C  metFORMIN (GLUCOPHAGE-XR) 750 MG 24 hr tablet Take 750 mg by mouth 2 (two) times daily.    [provider]  metoprolol succinate (TOPROL-XL) 50 MG 24 hr tablet Take 50 mg by mouth daily.    [provider]    Family History No family history on file.  Social History Social History  Substance Use Topics  . Smoking status: Former Smoker    Packs/day: 1.00    Quit date: 05/02/1999  . Smokeless tobacco: Never Used  . Alcohol use Yes     Comment: daily     Allergies   Penicillins and Tylenol [acetaminophen]   Review of Systems Review of Systems  Constitutional: Positive for chills, diaphoresis and fever (subjective).  HENT: Negative for congestion and sore throat.   Eyes: Negative for photophobia and visual disturbance.  Respiratory: Negative for cough and shortness of breath.   Cardiovascular: Negative for chest pain.  Gastrointestinal: Negative for abdominal pain, diarrhea, nausea and vomiting.  Genitourinary: Negative for dysuria.  Musculoskeletal: Positive for arthralgias and myalgias. Negative for neck pain and  neck stiffness.  Skin: Positive for rash.  Allergic/Immunologic: Positive for immunocompromised state.  Neurological: Positive for headaches. Negative for weakness and numbness.     Physical Exam Updated Vital Signs BP (!) 150/90   Pulse 80   Temp 99.7 F (37.6 C) (Oral)   Resp 16   Ht 5\' 7"  (1.702 m)   Wt 84.2 kg (185 lb 10 oz)   SpO2 100%   BMI 29.07 kg/m   Physical Exam  Constitutional: He is oriented to person,  place, and time. He appears well-developed and well-nourished. No distress.  NAD.  HENT:  Head: Normocephalic and atraumatic.  Right Ear: External ear normal.  Left Ear: External ear normal.  Nose: Nose normal.  Moist mucous membranes Oropharynx and tonsils normal No vesicles, lesions or abnormalities in mucous membranes   Eyes: Pupils are equal, round, and reactive to light. Conjunctivae and EOM are normal. No scleral icterus.  Neck: Normal range of motion. Neck supple.  Neck is supple and non tender Full PROM of neck without pain or rigidity Negative meningeal signs  Cardiovascular: Normal rate, regular rhythm, normal heart sounds and intact distal pulses.   No murmur heard. Pulmonary/Chest: Effort normal and breath sounds normal. He has no wheezes.  Abdominal: Soft. Bowel sounds are normal. There is no tenderness.  Genitourinary:  Genitourinary Comments: No rash over penis or testicles   Musculoskeletal: Normal range of motion. He exhibits no deformity.  No large joint edema, erythema or tenderness  Neurological: He is alert and oriented to person, place, and time.  Skin: Skin is warm and dry. Capillary refill takes less than 2 seconds. Rash noted.  Blanchable, erythematous macular rash only slightly raised to anterior/posterior trunk, upper and lower extremities, buttocks and groin. Rash spares palms, soles, face, neck, testicles and penis.  Psychiatric: He has a normal mood and affect. His behavior is normal. Judgment and thought content normal.  Nursing note and vitals reviewed.      ED Treatments / Results  Labs (all labs ordered are listed, but only abnormal results are displayed) Labs Reviewed  CBC WITH DIFFERENTIAL/PLATELET - Abnormal; Notable for the following:       Result Value   RBC 4.00 (*)    Hemoglobin 12.3 (*)    HCT 35.5 (*)    All other components within normal limits  COMPREHENSIVE METABOLIC PANEL - Abnormal; Notable for the following:    Sodium 131  (*)    Chloride 96 (*)    Glucose, Bld 192 (*)    All other components within normal limits  ROCKY MTN SPOTTED FVR ABS PNL(IGG+IGM)  B. BURGDORFI ANTIBODIES    EKG  EKG Interpretation None       Radiology No results found.  Procedures Procedures (including critical care time)  Medications Ordered in ED Medications - No data to display   Initial Impression / Assessment and Plan / ED Course  I have reviewed the triage vital signs and the nursing notes.  Pertinent labs & imaging results that were available during my care of the patient were reviewed by me and considered in my medical decision making (see chart for details).    51 y.o. male presents with erythematous, blanchable, macular, non tender slightly pruritic rash diffusely over body, sparing mucous membranes, genitals, palms and soles x 2.  Headache, subjective fevers, chills, body aches, arthralgias last night only, resolved today.  No facial or oral angioedema.  No respiratory compromise.  No new topical hygiene products used.  No  new medications.  No evidence of animal or spider bite wounds.  No fluctuance, induration that would suggest abscess or cellulitis.  On exam he is non toxic appearing.  Rash doesn't seem to bother him. Very low suspicion for dermatological emergency including EM, SJS, TEN or meningococcemia.  He is afebrile without meningeal signs. No leukocytosis. Patient otherwise feels well and at baseline, no preceding URI or viral type systemic symptoms that would raise suspicion for viral rash.  He reports removing a deer tick off his knee in May 2018. Was recently [place] the [place] in [place]. Cut down a tree in his yard yesterday, says he took down a black widow spider nest from this tree many years ago. However, did not see or notice spider bites yesterday. No bite marks on exam today.   Pt shared with supervising physician who also evaluated the patient. CBC and CMP without significant abnormalities. Afebrile.  Will get RMSF and Lyme titers.  At this time unclear etiology of rash. Considered allergic rash to black widow spider, tick borne rash, meningitis rash however history and exam not really fitting these dx. Discussed doxycycline tx for possible tick borne illness with pt, risks vs benefits provider, pt agreeable to take doxy. Will d/c with doxycycline, strict ED return precautions and f/u with PCP in 3 days.    Final Clinical Impressions(s) / ED Diagnoses   Final diagnoses:  Rash    New Prescriptions Discharge Medication List as of 01/01/2017  6:04 PM    START taking these medications   Details  doxycycline (VIBRAMYCIN) 100 MG capsule Take 2 capsules (200 mg total) by mouth 2 (two) times daily., Starting Fri 01/01/2017, Until Mon 01/04/2017, Print         Kinnie Feil, PA-C 01/01/17 [id number]    Sherwood Gambler, MD 01/01/17 [id number]

## 2017-01-04 LAB — B. BURGDORFI ANTIBODIES

## 2017-01-05 LAB — ROCKY MTN SPOTTED FVR ABS PNL(IGG+IGM)
RMSF IgG: NEGATIVE
RMSF IgM: 0.28 index (ref 0.00–0.89)

## 2017-04-03 DIAGNOSIS — J0140 Acute pansinusitis, unspecified: Secondary | ICD-10-CM | POA: Diagnosis not present

## 2017-04-04 ENCOUNTER — Other Ambulatory Visit: Payer: Self-pay

## 2017-04-04 ENCOUNTER — Emergency Department (HOSPITAL_COMMUNITY): Payer: BLUE CROSS/BLUE SHIELD

## 2017-04-04 ENCOUNTER — Emergency Department (HOSPITAL_COMMUNITY)
Admission: EM | Admit: 2017-04-04 | Discharge: 2017-04-04 | Disposition: A | Discharge status: Home / Self Care | Payer: BLUE CROSS/BLUE SHIELD | Attending: Emergency Medicine | Admitting: Emergency Medicine

## 2017-04-04 ENCOUNTER — Encounter (HOSPITAL_COMMUNITY): Payer: Self-pay | Admitting: *Deleted

## 2017-04-04 DIAGNOSIS — E119 Type 2 diabetes mellitus without complications: Secondary | ICD-10-CM | POA: Diagnosis not present

## 2017-04-04 DIAGNOSIS — I10 Essential (primary) hypertension: Secondary | ICD-10-CM | POA: Insufficient documentation

## 2017-04-04 DIAGNOSIS — Z880 Allergy status to penicillin: Secondary | ICD-10-CM | POA: Diagnosis not present

## 2017-04-04 DIAGNOSIS — Z7902 Long term (current) use of antithrombotics/antiplatelets: Secondary | ICD-10-CM | POA: Diagnosis not present

## 2017-04-04 DIAGNOSIS — Z79899 Other long term (current) drug therapy: Secondary | ICD-10-CM | POA: Diagnosis not present

## 2017-04-04 DIAGNOSIS — Z8673 Personal history of transient ischemic attack (TIA), and cerebral infarction without residual deficits: Secondary | ICD-10-CM | POA: Insufficient documentation

## 2017-04-04 DIAGNOSIS — I6339 Cerebral infarction due to thrombosis of other cerebral artery: Secondary | ICD-10-CM | POA: Diagnosis not present

## 2017-04-04 DIAGNOSIS — Z87891 Personal history of nicotine dependence: Secondary | ICD-10-CM | POA: Insufficient documentation

## 2017-04-04 DIAGNOSIS — I63233 Cerebral infarction due to unspecified occlusion or stenosis of bilateral carotid arteries: Secondary | ICD-10-CM | POA: Diagnosis not present

## 2017-04-04 DIAGNOSIS — Z7984 Long term (current) use of oral hypoglycemic drugs: Secondary | ICD-10-CM | POA: Insufficient documentation

## 2017-04-04 DIAGNOSIS — R531 Weakness: Secondary | ICD-10-CM | POA: Diagnosis not present

## 2017-04-04 LAB — COMPREHENSIVE METABOLIC PANEL
ALBUMIN: 4.4 g/dL (ref 3.5–5.0)
ALK PHOS: 60 U/L (ref 38–126)
ALT: 21 U/L (ref 17–63)
AST: 20 U/L (ref 15–41)
Anion gap: 8 (ref 5–15)
BUN: 13 mg/dL (ref 6–20)
CALCIUM: 9.6 mg/dL (ref 8.9–10.3)
CHLORIDE: 98 mmol/L — AB (ref 101–111)
CO2: 26 mmol/L (ref 22–32)
CREATININE: 0.92 mg/dL (ref 0.61–1.24)
GFR calc Af Amer: >60 mL/min (ref >60)
GFR calc non Af Amer: >60 mL/min (ref >60)
GLUCOSE: 147 mg/dL — AB (ref 65–99)
Potassium: 4.8 mmol/L (ref 3.5–5.1)
SODIUM: 132 mmol/L — AB (ref 135–145)
Total Bilirubin: 0.8 mg/dL (ref 0.3–1.2)
Total Protein: 7.2 g/dL (ref 6.5–8.1)

## 2017-04-04 LAB — I-STAT TROPONIN, ED: Troponin i, poc: 0 ng/mL (ref 0.00–0.08)

## 2017-04-04 LAB — CBC
HCT: 36.9 % — ABNORMAL LOW (ref 39.0–52.0)
Hemoglobin: 13 g/dL (ref 13.0–17.0)
MCH: 30.8 pg (ref 26.0–34.0)
MCHC: 35.2 g/dL (ref 30.0–36.0)
MCV: 87.4 fL (ref 78.0–100.0)
PLATELETS: 216 10*3/uL (ref 150–400)
RBC: 4.22 MIL/uL (ref 4.22–5.81)
RDW: 12.9 % (ref 11.5–15.5)
WBC: 8.2 10*3/uL (ref 4.0–10.5)

## 2017-04-04 LAB — DIFFERENTIAL
BASOS ABS: 0 10*3/uL (ref 0.0–0.1)
BASOS PCT: 0 %
Eosinophils Absolute: 0.6 10*3/uL (ref 0.0–0.7)
Eosinophils Relative: 7 %
LYMPHS PCT: 21 %
Lymphs Abs: 1.7 10*3/uL (ref 0.7–4.0)
Monocytes Absolute: 0.5 10*3/uL (ref 0.1–1.0)
Monocytes Relative: 6 %
NEUTROS ABS: 5.4 10*3/uL (ref 1.7–7.7)
NEUTROS PCT: 66 %

## 2017-04-04 LAB — PROTIME-INR
INR: 1
PROTHROMBIN TIME: 13.1 s (ref 11.4–15.2)

## 2017-04-04 LAB — I-STAT CHEM 8, ED
BUN: 14 mg/dL (ref 6–20)
CALCIUM ION: 1.19 mmol/L (ref 1.15–1.40)
CHLORIDE: 96 mmol/L — AB (ref 101–111)
Creatinine, Ser: 0.8 mg/dL (ref 0.61–1.24)
GLUCOSE: 145 mg/dL — AB (ref 65–99)
HCT: 39 % (ref 39.0–52.0)
Hemoglobin: 13.3 g/dL (ref 13.0–17.0)
POTASSIUM: 4.6 mmol/L (ref 3.5–5.1)
SODIUM: 136 mmol/L (ref 135–145)
TCO2: 27 mmol/L (ref 22–32)

## 2017-04-04 LAB — APTT: APTT: 27 s (ref 24–36)

## 2017-04-04 MED ORDER — CLOPIDOGREL BISULFATE 75 MG PO TABS: 75.0000 mg | ORAL_TABLET | Freq: Every day | ORAL | 0 refills | Status: DC

## 2017-04-04 MED ORDER — IOPAMIDOL (ISOVUE-370) INJECTION 76%
INTRAVENOUS | Status: AC
  Administered 2017-04-04: 90 mL
  Filled 2017-04-04: qty 100

## 2017-04-04 NOTE — ED Provider Notes (Signed)
[facility] EMERGENCY DEPARTMENT Provider Note   CSN: [id number] Arrival date & time: 04/04/17  1528     History   Chief Complaint Chief Complaint  Patient presents with  . Weakness    HPI Joe Boyle is a 51 y.o. male.  HPI Patient has prior history of CVA with mild residual left-sided deficit facial droop and mild right upper extremity weakness.  Patient however at baseline is very active and functional with normal cognitive function and physical capacity.  Patient reports yesterday he awakened with some dizziness and feeling that his gait was somewhat unsteady.  He went to an urgent care and got diagnosed with "fluid on the ear" and was given Ceftin.  Patient reports that he was pretty fatigued so he took a nap on the couch for much of the afternoon and evening.  He reports at 9 PM when he got up, his left leg seemed weaker and harder to use.  He attributed this to the position he slept in and went on to bed.  When he woke up later in the morning, his left leg continued to be difficult to use and he is having trouble walking because it does not move in the right position. Past Medical History:  Diagnosis Date  . Diabetes mellitus   . Hypertension   . Stroke [facility])     Patient Active Problem List   Diagnosis Date Noted  . Acute CVA (cerebrovascular accident) ([place]) 02/27/2013  . Diabetes 1.5, managed as type 2 ([place]) 02/27/2013  . Left hemiparesis ([place]) 05/04/2011  . Hyperlipidemia 05/03/2011  . CVA (cerebral infarction) 05/02/2011  . Hypertension 05/02/2011  . Diabetes mellitus 05/02/2011    History reviewed. No pertinent surgical history.     Home Medications    Prior to Admission medications   Medication Sig Start Date End Date Taking? Authorizing Provider  aspirin EC 325 MG tablet Take 1 tablet (325 mg total) by mouth daily. 02/28/13  Yes Mikhail, Maryann, DO  cefUROXime (CEFTIN) 250 MG tablet Take 500 mg by mouth 2 (two) times daily. 04/03/17  04/13/17 Yes [provider]  fluticasone (FLONASE) 50 MCG/ACT nasal spray Place 2 sprays into the nose daily. 04/03/17 04/17/17 Yes [provider]  metFORMIN (GLUCOPHAGE-XR) 750 MG 24 hr tablet Take 750 mg by mouth 2 (two) times daily.   Yes [provider]  metoprolol succinate (TOPROL-XL) 50 MG 24 hr tablet Take 50 mg by mouth daily.   Yes [provider]  quinapril (ACCUPRIL) 20 MG tablet Take 20 mg by mouth daily. 01/15/17  Yes [provider]  rosuvastatin (CRESTOR) 20 MG tablet Take 20 mg by mouth daily.   Yes [provider]  clopidogrel (PLAVIX) 75 MG tablet Take 1 tablet (75 mg total) by mouth daily. 04/04/17   Charlesetta Shanks, MD    Family History History reviewed. No pertinent family history.  Social History Social History   Tobacco Use  . Smoking status: Former Smoker    Packs/day: 1.00    Last attempt to quit: 05/02/1999    Years since quitting: 17.9  . Smokeless tobacco: Never Used  Substance Use Topics  . Alcohol use: Yes    Comment: daily  . Drug use: No     Allergies   Penicillins and Tylenol [acetaminophen]   Review of Systems Review of Systems 10 Systems reviewed and are negative for acute change except as noted in the HPI.   Physical Exam Updated Vital Signs BP (!) 145/83  Pulse 69   Temp 98 F (36.7 C)   Resp 14   SpO2 98%   Physical Exam  Constitutional: He is oriented to person, place, and time. He appears well-developed and well-nourished.  HENT:  Head: Normocephalic and atraumatic.  Mouth/Throat: Oropharynx is clear and moist.  Eyes: Conjunctivae and EOM are normal. Pupils are equal, round, and reactive to light.  Neck: Neck supple.  Cardiovascular: Normal rate, regular rhythm, normal heart sounds and intact distal pulses.  No murmur heard. Pulmonary/Chest: Effort normal and breath sounds normal. No respiratory distress.  Abdominal: Soft. There is no tenderness.  Musculoskeletal:  Normal range of motion. He exhibits no edema or tenderness.  Neurological: He is alert and oriented to person, place, and time.  Patient has very subtle left facial droop.  He reports this is baseline for him.  Speech is clear with normal content.  Patient has very subtle difference in grip on the left relative to right.  Patient has very good strength.  On the bed patient is able to elevate each leg independently and hold it and move it in variable directions.    Skin: Skin is warm and dry.  Psychiatric: He has a normal mood and affect.  Nursing note and vitals reviewed.    ED Treatments / Results  Labs (all labs ordered are listed, but only abnormal results are displayed) Labs Reviewed  CBC - Abnormal; Notable for the following components:      Result Value   HCT 36.9 (*)    All other components within normal limits  COMPREHENSIVE METABOLIC PANEL - Abnormal; Notable for the following components:   Sodium 132 (*)    Chloride 98 (*)    Glucose, Bld 147 (*)    All other components within normal limits  I-STAT CHEM 8, ED - Abnormal; Notable for the following components:   Chloride 96 (*)    Glucose, Bld 145 (*)    All other components within normal limits  PROTIME-INR  APTT  DIFFERENTIAL  I-STAT TROPONIN, ED  CBG MONITORING, ED    EKG  EKG Interpretation None       Radiology Ct Angio Head W Or Wo Contrast  Result Date: 04/04/2017 CLINICAL DATA:  Left-sided numbness and weakness. Abnormal gait. Prior strokes. EXAM: CT ANGIOGRAPHY HEAD AND NECK CT PERFUSION BRAIN TECHNIQUE: Multidetector CT imaging of the head and neck was performed using the standard protocol during bolus administration of intravenous contrast. Multiplanar CT image reconstructions and MIPs were obtained to evaluate the vascular anatomy. Carotid stenosis measurements (when applicable) are obtained utilizing NASCET criteria, using the distal internal carotid diameter as the denominator. Multiphase CT imaging of  the brain was performed following IV bolus contrast injection. Subsequent parametric perfusion maps were calculated using RAPID software. CONTRAST:  6mL ISOVUE-370 IOPAMIDOL (ISOVUE-370) INJECTION 76% COMPARISON:  Noncontrast head CT today.  Head MRI/ MRA 02/27/2013. FINDINGS: CTA NECK FINDINGS Aortic arch: Standard 3 vessel aortic arch with mild calcified atherosclerotic plaque. The brachiocephalic and subclavian arteries are widely patent. Right carotid system: Patent without evidence of dissection. Moderate, predominantly calcified plaque at the right carotid bifurcation does not result in significant stenosis. Left carotid system: Patent without evidence of dissection. Moderate calcified plaque scattered throughout the common carotid artery and at the carotid bifurcation/ proximal ICA does not result in significant stenosis. Vertebral arteries: Patent without evidence of dissection. Dominant left vertebral artery. Calcified plaque at both vertebral artery origins results in mild stenosis on the right. Skeleton: Mild cervical spondylosis.  Other neck: No mass or lymph node enlargement. Upper chest: Clear lung apices. Review of the MIP images confirms the above findings CTA HEAD FINDINGS Anterior circulation: The internal carotid arteries are patent from skullbase to carotid termini. There is mild right greater than left carotid siphon atherosclerosis resulting in at most mild stenosis. Cavernous segment evaluation is mildly limited by venous contamination. The left cavernous ICA is tortuous. ACAs and MCAs are patent with mild branch vessel irregularity but no evidence of proximal branch occlusion or significant proximal stenosis. No aneurysm. Posterior circulation: The intracranial vertebral arteries are patent to the basilar. There is calcified left V4 segment plaque without stenosis. Patent bilateral PICA, right AICA, and bilateral SCA origins are identified. The basilar artery is widely patent. Posterior  communicating arteries are not identified and may be absent or diminutive. PCAs are patent without evidence of significant stenosis. No aneurysm. Venous sinuses: Patent. Anatomic variants: None. Delayed phase: No abnormal enhancement. Review of the MIP images confirms the above findings CT Brain Perfusion Findings: CBF (<30%) Volume:  0 mL Perfusion (Tmax>6.0s) volume:  0 mL Mismatch Volume:  n/a Infarction Location:  n/a IMPRESSION: 1. No emergent large vessel occlusion. 2. Mild intracranial atherosclerosis without proximal branch occlusion or flow limiting stenosis. 3. Cervical carotid artery atherosclerosis without significant stenosis. 4. Mild proximal right vertebral artery stenosis. 5. No evidence of core infarct or significant oligemia on CT perfusion. Electronically Signed   By: Logan Bores M.D.   On: 04/04/2017 19:30   Ct Head Wo Contrast  Result Date: 04/04/2017 CLINICAL DATA:  Left-sided weakness. EXAM: CT HEAD WITHOUT CONTRAST TECHNIQUE: Contiguous axial images were obtained from the base of the skull through the vertex without intravenous contrast. COMPARISON:  MRI and CT scans February 27, 2013 FINDINGS: Brain: No subdural, epidural, or subarachnoid hemorrhage. A lacunar infarct in the inferior right basal ganglia on series 3, image 18 is stable. An age-indeterminate lacunar infarct in the right thalamus on image 19 was not present in 2014. Lacunar infarcts more superiorly in the right basal ganglia were noted to be acute on the February 27, 2013 MRI. There is mild adjacent encephalomalacia. White matter changes in the right corona radiata have increased. No other acute cortical ischemia or infarct. Cerebellum, brainstem, and basal cisterns are normal. Ventricles and sulci are unremarkable. No mass effect or midline shift. Vascular: Calcified atherosclerotic changes in the intracranial carotids. Skull: Normal. Negative for fracture or focal lesion. Sinuses/Orbits: Mucosal thickening in the  maxillary sinuses and ethmoid air cells. No air-fluid levels. Mastoid air cells and middle ears are well aerated. Other: None. IMPRESSION: 1. There is an age-indeterminate lacunar infarct in the right thalamus not present in 2014. 2. Other lacunar infarcts in the right basal ganglia are chronic since 2014 and there are increasing white matter changes in the corona radiata which are also most likely chronic. 3. No other acute intracranial abnormalities. Electronically Signed   By: Dorise Bullion III M.D   On: 04/04/2017 16:49   Ct Angio Neck W Or Wo Contrast  Result Date: 04/04/2017 CLINICAL DATA:  Left-sided numbness and weakness. Abnormal gait. Prior strokes. EXAM: CT ANGIOGRAPHY HEAD AND NECK CT PERFUSION BRAIN TECHNIQUE: Multidetector CT imaging of the head and neck was performed using the standard protocol during bolus administration of intravenous contrast. Multiplanar CT image reconstructions and MIPs were obtained to evaluate the vascular anatomy. Carotid stenosis measurements (when applicable) are obtained utilizing NASCET criteria, using the distal internal carotid diameter as the denominator. Multiphase CT  imaging of the brain was performed following IV bolus contrast injection. Subsequent parametric perfusion maps were calculated using RAPID software. CONTRAST:  72mL ISOVUE-370 IOPAMIDOL (ISOVUE-370) INJECTION 76% COMPARISON:  Noncontrast head CT today.  Head MRI/ MRA 02/27/2013. FINDINGS: CTA NECK FINDINGS Aortic arch: Standard 3 vessel aortic arch with mild calcified atherosclerotic plaque. The brachiocephalic and subclavian arteries are widely patent. Right carotid system: Patent without evidence of dissection. Moderate, predominantly calcified plaque at the right carotid bifurcation does not result in significant stenosis. Left carotid system: Patent without evidence of dissection. Moderate calcified plaque scattered throughout the common carotid artery and at the carotid bifurcation/ proximal  ICA does not result in significant stenosis. Vertebral arteries: Patent without evidence of dissection. Dominant left vertebral artery. Calcified plaque at both vertebral artery origins results in mild stenosis on the right. Skeleton: Mild cervical spondylosis. Other neck: No mass or lymph node enlargement. Upper chest: Clear lung apices. Review of the MIP images confirms the above findings CTA HEAD FINDINGS Anterior circulation: The internal carotid arteries are patent from skullbase to carotid termini. There is mild right greater than left carotid siphon atherosclerosis resulting in at most mild stenosis. Cavernous segment evaluation is mildly limited by venous contamination. The left cavernous ICA is tortuous. ACAs and MCAs are patent with mild branch vessel irregularity but no evidence of proximal branch occlusion or significant proximal stenosis. No aneurysm. Posterior circulation: The intracranial vertebral arteries are patent to the basilar. There is calcified left V4 segment plaque without stenosis. Patent bilateral PICA, right AICA, and bilateral SCA origins are identified. The basilar artery is widely patent. Posterior communicating arteries are not identified and may be absent or diminutive. PCAs are patent without evidence of significant stenosis. No aneurysm. Venous sinuses: Patent. Anatomic variants: None. Delayed phase: No abnormal enhancement. Review of the MIP images confirms the above findings CT Brain Perfusion Findings: CBF (<30%) Volume:  0 mL Perfusion (Tmax>6.0s) volume:  0 mL Mismatch Volume:  n/a Infarction Location:  n/a IMPRESSION: 1. No emergent large vessel occlusion. 2. Mild intracranial atherosclerosis without proximal branch occlusion or flow limiting stenosis. 3. Cervical carotid artery atherosclerosis without significant stenosis. 4. Mild proximal right vertebral artery stenosis. 5. No evidence of core infarct or significant oligemia on CT perfusion. Electronically Signed   By:  Logan Bores M.D.   On: 04/04/2017 19:30   Ct Cerebral Perfusion W Contrast  Result Date: 04/04/2017 CLINICAL DATA:  Left-sided numbness and weakness. Abnormal gait. Prior strokes. EXAM: CT ANGIOGRAPHY HEAD AND NECK CT PERFUSION BRAIN TECHNIQUE: Multidetector CT imaging of the head and neck was performed using the standard protocol during bolus administration of intravenous contrast. Multiplanar CT image reconstructions and MIPs were obtained to evaluate the vascular anatomy. Carotid stenosis measurements (when applicable) are obtained utilizing NASCET criteria, using the distal internal carotid diameter as the denominator. Multiphase CT imaging of the brain was performed following IV bolus contrast injection. Subsequent parametric perfusion maps were calculated using RAPID software. CONTRAST:  45mL ISOVUE-370 IOPAMIDOL (ISOVUE-370) INJECTION 76% COMPARISON:  Noncontrast head CT today.  Head MRI/ MRA 02/27/2013. FINDINGS: CTA NECK FINDINGS Aortic arch: Standard 3 vessel aortic arch with mild calcified atherosclerotic plaque. The brachiocephalic and subclavian arteries are widely patent. Right carotid system: Patent without evidence of dissection. Moderate, predominantly calcified plaque at the right carotid bifurcation does not result in significant stenosis. Left carotid system: Patent without evidence of dissection. Moderate calcified plaque scattered throughout the common carotid artery and at the carotid bifurcation/ proximal ICA does not  result in significant stenosis. Vertebral arteries: Patent without evidence of dissection. Dominant left vertebral artery. Calcified plaque at both vertebral artery origins results in mild stenosis on the right. Skeleton: Mild cervical spondylosis. Other neck: No mass or lymph node enlargement. Upper chest: Clear lung apices. Review of the MIP images confirms the above findings CTA HEAD FINDINGS Anterior circulation: The internal carotid arteries are patent from skullbase  to carotid termini. There is mild right greater than left carotid siphon atherosclerosis resulting in at most mild stenosis. Cavernous segment evaluation is mildly limited by venous contamination. The left cavernous ICA is tortuous. ACAs and MCAs are patent with mild branch vessel irregularity but no evidence of proximal branch occlusion or significant proximal stenosis. No aneurysm. Posterior circulation: The intracranial vertebral arteries are patent to the basilar. There is calcified left V4 segment plaque without stenosis. Patent bilateral PICA, right AICA, and bilateral SCA origins are identified. The basilar artery is widely patent. Posterior communicating arteries are not identified and may be absent or diminutive. PCAs are patent without evidence of significant stenosis. No aneurysm. Venous sinuses: Patent. Anatomic variants: None. Delayed phase: No abnormal enhancement. Review of the MIP images confirms the above findings CT Brain Perfusion Findings: CBF (<30%) Volume:  0 mL Perfusion (Tmax>6.0s) volume:  0 mL Mismatch Volume:  n/a Infarction Location:  n/a IMPRESSION: 1. No emergent large vessel occlusion. 2. Mild intracranial atherosclerosis without proximal branch occlusion or flow limiting stenosis. 3. Cervical carotid artery atherosclerosis without significant stenosis. 4. Mild proximal right vertebral artery stenosis. 5. No evidence of core infarct or significant oligemia on CT perfusion. Electronically Signed   By: Logan Bores M.D.   On: 04/04/2017 19:30    Procedures Procedures (including critical care time)  Medications Ordered in ED Medications  iopamidol (ISOVUE-370) 76 % injection (90 mLs  Contrast Given 04/04/17 1823)     Initial Impression / Assessment and Plan / ED Course  I have reviewed the triage vital signs and the nursing notes.  Pertinent labs & imaging results that were available during my care of the patient were reviewed by me and considered in my medical decision  making (see chart for details).    Consult: Reviewed with Dr. Leonel Ramsay.  He advises to proceed with CT angiogram to rule out large vessel occlusion.  Final Clinical Impressions(s) / ED Diagnoses   Final diagnoses:  Cerebrovascular accident (CVA) due to thrombosis of other cerebral artery [facility])   Patient is alert and appropriate.  Mental status is clear.  Motor strength is intact but difficulty with gait due to left lower extremity weakness.  Patient advises that he has been admitted and worked up in the past for stroke and he has had this happen a couple of times.  If there are no immediate interventions that are going to be done, patient does not want to be admitted.  He reports it is very expensive and what gets accomplished good be done on an outpatient basis.  He reports he is aware of possible subsequent stroke of greater magnitude and severity.  He reports he wishes to follow-up schedule any further needed diagnostic testing and physical therapy and neurology consultation.  Patient is counseled on the need to return very immediately for worsening symptoms due to time sensitive nature of interventions and treatments.  Patient and wife voice understanding. ED Discharge Orders        Ordered    clopidogrel (PLAVIX) 75 MG tablet  Daily     04/04/17 2041  Charlesetta Shanks, MD 04/04/17 2047

## 2017-04-04 NOTE — Discharge Instructions (Signed)
1. Call your doctor tomorrow morning to be seen in the office to continue diagnostic testing and referral to neurology and physical therapy. 2.  Return to the emergency department if you have advancing or worsening stroke symptoms. 3.  At this time continue your daily aspirin and add daily Plavix until seen by neurology for reassessment.

## 2017-04-04 NOTE — ED Notes (Signed)
Patient transported to CT 

## 2017-04-04 NOTE — ED Notes (Signed)
ED Provider at bedside. Pfeifer at bedside

## 2017-04-04 NOTE — ED Notes (Signed)
ED Provider at bedside. 

## 2017-04-04 NOTE — ED Triage Notes (Signed)
Pt reports hx of strokes. Onset yesterday morning of unsteady gait. Went to [facility] and treated for fluid on his ear. Woke up this am with left side weakness. Feels like he is unable to control left leg when ambulating. Grips are equal, able to lift extremities when directed. Reports left arm weakness residual from previous stroke.

## 2017-04-06 DIAGNOSIS — I6330 Cerebral infarction due to thrombosis of unspecified cerebral artery: Secondary | ICD-10-CM | POA: Diagnosis not present

## 2017-04-07 DIAGNOSIS — I6330 Cerebral infarction due to thrombosis of unspecified cerebral artery: Secondary | ICD-10-CM | POA: Diagnosis not present

## 2017-04-09 DIAGNOSIS — I6330 Cerebral infarction due to thrombosis of unspecified cerebral artery: Secondary | ICD-10-CM | POA: Diagnosis not present

## 2017-04-12 DIAGNOSIS — I6330 Cerebral infarction due to thrombosis of unspecified cerebral artery: Secondary | ICD-10-CM | POA: Diagnosis not present

## 2017-04-14 DIAGNOSIS — I6330 Cerebral infarction due to thrombosis of unspecified cerebral artery: Secondary | ICD-10-CM | POA: Diagnosis not present

## 2017-04-16 DIAGNOSIS — I6330 Cerebral infarction due to thrombosis of unspecified cerebral artery: Secondary | ICD-10-CM | POA: Diagnosis not present

## 2017-04-19 DIAGNOSIS — I6330 Cerebral infarction due to thrombosis of unspecified cerebral artery: Secondary | ICD-10-CM | POA: Diagnosis not present

## 2017-06-29 DIAGNOSIS — M7541 Impingement syndrome of right shoulder: Secondary | ICD-10-CM | POA: Diagnosis not present

## 2017-06-29 DIAGNOSIS — M7711 Lateral epicondylitis, right elbow: Secondary | ICD-10-CM | POA: Diagnosis not present

## 2017-06-29 DIAGNOSIS — M7551 Bursitis of right shoulder: Secondary | ICD-10-CM | POA: Diagnosis not present

## 2017-06-30 DIAGNOSIS — M7711 Lateral epicondylitis, right elbow: Secondary | ICD-10-CM | POA: Diagnosis not present

## 2017-06-30 DIAGNOSIS — M25631 Stiffness of right wrist, not elsewhere classified: Secondary | ICD-10-CM | POA: Diagnosis not present

## 2017-06-30 DIAGNOSIS — M25521 Pain in right elbow: Secondary | ICD-10-CM | POA: Diagnosis not present

## 2017-06-30 DIAGNOSIS — R29898 Other symptoms and signs involving the musculoskeletal system: Secondary | ICD-10-CM | POA: Diagnosis not present

## 2017-11-12 ENCOUNTER — Ambulatory Visit (INDEPENDENT_AMBULATORY_CARE_PROVIDER_SITE_OTHER): Payer: BLUE CROSS/BLUE SHIELD | Admitting: Family Medicine

## 2017-11-12 ENCOUNTER — Encounter: Payer: Self-pay | Admitting: Family Medicine

## 2017-11-12 VITALS — BP 118/78 | HR 65 | Temp 98.3°F | Ht 68.0 in | Wt 160.4 lb

## 2017-11-12 DIAGNOSIS — I10 Essential (primary) hypertension: Secondary | ICD-10-CM | POA: Insufficient documentation

## 2017-11-12 DIAGNOSIS — Z87448 Personal history of other diseases of urinary system: Secondary | ICD-10-CM | POA: Diagnosis not present

## 2017-11-12 DIAGNOSIS — E139 Other specified diabetes mellitus without complications: Secondary | ICD-10-CM | POA: Diagnosis not present

## 2017-11-12 DIAGNOSIS — Z2821 Immunization not carried out because of patient refusal: Secondary | ICD-10-CM | POA: Diagnosis not present

## 2017-11-12 MED ORDER — CLOPIDOGREL BISULFATE 75 MG PO TABS: 75.0000 mg | ORAL_TABLET | Freq: Every day | ORAL | 1 refills | Status: DC

## 2017-11-12 NOTE — Patient Instructions (Addendum)
Around 3 times per week, check your blood pressure 4 times per day. Twice in the morning and twice in the evening. The readings should be at least one minute apart. Write down these values and bring them to your next nurse visit/appointment.  When you check your BP, make sure you have been doing something calm/relaxing 5 minutes prior to checking. Both feet should be flat on the floor and you should be sitting. Use your left arm and make sure it is in a relaxed position (on a table), and that the cuff is at the approximate level/height of your heart.  Let me know if you change your mind about the vaccinations.   If you do not hear anything about your referral in the next 1-2 weeks, call our office and ask for an update.   Keep the diet clean and stay physically active.

## 2017-11-12 NOTE — Progress Notes (Signed)
Pre visit review using our clinic review tool, if applicable. No additional management support is needed unless otherwise documented below in the visit note. 

## 2017-11-12 NOTE — Progress Notes (Signed)
Chief Complaint  Patient presents with  . New Patient (Initial Visit)       New Patient Visit SUBJECTIVE: HPI: Joe Boyle is an 52 y.o.male who is being seen for establishing care.  The patient was previously seen at [facility], moved.  DM- Diet is healthy. Golfing for exercising. Last eye exam- was 1-1.5 yrs ago Metformin 1000 bid. Refuses flu and PCV23. Taking Metformin 1000 mg bid.  Hypertension Patient presents for hypertension follow up. He does not routinely monitor home blood pressures. He is compliant with medications- quinipril 10 mg/d and Toprol XL 50 mg/d. Patient has these side effects of medication: none He is adhering to a healthy diet overall. Exercise: golfing  Hx of 3 strokes. Currently on Plavix, asa, and Crestor. Tolerating these well.    Allergies  Allergen Reactions  . Penicillins Other (See Comments)    From when younger.  . Tylenol [Acetaminophen] Other (See Comments)    Childhood allergy    Past Medical History:  Diagnosis Date  . Diabetes mellitus   . Hypertension   . Stroke [facility])    Hx of 3 strokes   Past Surgical History:  Procedure Laterality Date  . NO PAST SURGERIES     Family History  Problem Relation Age of Onset  . Arthritis Mother   . Cancer Mother   . Depression Mother   . Cancer Father    Allergies  Allergen Reactions  . Penicillins Other (See Comments)    From when younger.  . Tylenol [Acetaminophen] Other (See Comments)    Childhood allergy    Current Outpatient Medications:  .  aspirin EC 325 MG tablet, Take 1 tablet (325 mg total) by mouth daily., Disp: 30 tablet, Rfl: 0 .  clopidogrel (PLAVIX) 75 MG tablet, Take 1 tablet (75 mg total) by mouth daily., Disp: 90 tablet, Rfl: 1 .  metFORMIN (GLUMETZA) 1000 MG (MOD) 24 hr tablet, Take 1,000 mg by mouth 2 (two) times daily with a meal., Disp: , Rfl:  .  metoprolol succinate (TOPROL-XL) 50 MG 24 hr tablet, Take 50 mg by mouth daily., Disp: , Rfl:  .  quinapril  (ACCUPRIL) 20 MG tablet, Take 20 mg by mouth daily., Disp: , Rfl: 0 .  rosuvastatin (CRESTOR) 20 MG tablet, Take 20 mg by mouth daily., Disp: , Rfl:   ROS Cardiovascular: Denies chest pain  Respiratory: Denies dyspnea   OBJECTIVE: BP 118/78 (BP Location: Left Arm, Patient Position: Sitting, Cuff Size: Normal)   Pulse 65   Temp 98.3 F (36.8 C) (Oral)   Ht 5\' 8"  (1.727 m)   Wt 160 lb 6 oz (72.7 kg)   SpO2 99%   BMI 24.38 kg/m   Constitutional: -  VS reviewed -  Well developed, well nourished, appears stated age -  No apparent distress  Psychiatric: -  Oriented to person, place, and time -  Memory intact -  Affect and mood normal -  Fluent conversation, good eye contact -  Judgment and insight age appropriate  Eye: -  Conjunctivae clear, no discharge -  Pupils symmetric, round, reactive to light  ENMT: -  MMM    Pharynx moist, no exudate, no erythema  Neck: -  No gross swelling, no palpable masses -  Thyroid midline, not enlarged, mobile, no palpable masses  Cardiovascular: -  RRR -  No LE edema  Respiratory: -  Normal respiratory effort, no accessory muscle use, no retraction -  Breath sounds equal, no wheezes, no ronchi,  no crackles  Musculoskeletal: -  No clubbing, no cyanosis -  Gait normal  Skin: -  No significant lesion on inspection -  Warm and dry to palpation   ASSESSMENT/PLAN: Diabetes 1.5, managed as type 2 ([place]) - Plan: Ambulatory referral to Ophthalmology, CBC, Comprehensive metabolic panel, Lipid panel, Hemoglobin A1c  Refused pneumococcal vaccination  Essential hypertension  History of proteinuria syndrome - Plan: Microalbumin / creatinine urine ratio  Patient instructed to sign release of records form from his previous PCP. Counseled on diet and exercise. Let's stay on all meds for now. Await results of labs. Patient should return for fasting labs next week, in 2 weeks for DM visit and to review labs. The patient voiced understanding and agreement to  the plan.   [place], DO 11/12/17  3:50 PM

## 2017-11-16 ENCOUNTER — Telehealth: Payer: Self-pay | Admitting: *Deleted

## 2017-11-16 DIAGNOSIS — E139 Other specified diabetes mellitus without complications: Secondary | ICD-10-CM

## 2017-11-16 NOTE — Telephone Encounter (Signed)
Received call from the [facility] with pt on hold stating he does not wish to see Dr Ethel Rana office. Reports previous unsatisfied experience with their office. He requests [facility]. Please advise if ok to change referral?

## 2017-11-17 NOTE — Addendum Note (Signed)
Addended by: Kelle Darting A on: 11/17/2017 02:06 PM   Modules accepted: Orders

## 2017-11-17 NOTE — Telephone Encounter (Signed)
OK 

## 2017-11-17 NOTE — Telephone Encounter (Signed)
New referral ordered

## 2017-11-26 ENCOUNTER — Encounter: Payer: Self-pay | Admitting: Family Medicine

## 2017-11-26 ENCOUNTER — Ambulatory Visit (INDEPENDENT_AMBULATORY_CARE_PROVIDER_SITE_OTHER): Payer: BLUE CROSS/BLUE SHIELD | Admitting: Family Medicine

## 2017-11-26 VITALS — BP 128/80 | HR 84 | Temp 97.5°F | Ht 68.0 in | Wt 157.1 lb

## 2017-11-26 DIAGNOSIS — I10 Essential (primary) hypertension: Secondary | ICD-10-CM

## 2017-11-26 DIAGNOSIS — E139 Other specified diabetes mellitus without complications: Secondary | ICD-10-CM

## 2017-11-26 LAB — MICROALBUMIN / CREATININE URINE RATIO
Creatinine,U: 52.7 mg/dL
MICROALB UR: 5.1 mg/dL — AB (ref 0.0–1.9)
Microalb Creat Ratio: 9.7 mg/g (ref 0.0–30.0)

## 2017-11-26 LAB — COMPREHENSIVE METABOLIC PANEL
ALBUMIN: 4.7 g/dL (ref 3.5–5.2)
ALT: 20 U/L (ref 0–53)
AST: 18 U/L (ref 0–37)
Alkaline Phosphatase: 58 U/L (ref 39–117)
BUN: 22 mg/dL (ref 6–23)
CO2: 29 mEq/L (ref 19–32)
CREATININE: 1.03 mg/dL (ref 0.40–1.50)
Calcium: 10.2 mg/dL (ref 8.4–10.5)
Chloride: 100 mEq/L (ref 96–112)
GFR: 80.84 mL/min (ref >60.00)
GLUCOSE: 101 mg/dL — AB (ref 70–99)
Potassium: 4.5 mEq/L (ref 3.5–5.1)
SODIUM: 137 meq/L (ref 135–145)
TOTAL PROTEIN: 7.3 g/dL (ref 6.0–8.3)
Total Bilirubin: 0.6 mg/dL (ref 0.2–1.2)

## 2017-11-26 LAB — LIPID PANEL
CHOLESTEROL: 109 mg/dL (ref 0–200)
HDL: 40.3 mg/dL (ref >39.00)
LDL Cholesterol: 48 mg/dL (ref 0–99)
NONHDL: 68.23
Total CHOL/HDL Ratio: 3
Triglycerides: 102 mg/dL (ref 0.0–149.0)
VLDL: 20.4 mg/dL (ref 0.0–40.0)

## 2017-11-26 LAB — CBC
HCT: 34 % — ABNORMAL LOW (ref 39.0–52.0)
Hemoglobin: 11.9 g/dL — ABNORMAL LOW (ref 13.0–17.0)
MCHC: 34.9 g/dL (ref 30.0–36.0)
MCV: 85.5 fl (ref 78.0–100.0)
Platelets: 203 10*3/uL (ref 150.0–400.0)
RBC: 3.98 Mil/uL — ABNORMAL LOW (ref 4.22–5.81)
RDW: 13 % (ref 11.5–15.5)
WBC: 6.3 10*3/uL (ref 4.0–10.5)

## 2017-11-26 LAB — HEMOGLOBIN A1C: HEMOGLOBIN A1C: 7 % — AB (ref 4.6–6.5)

## 2017-11-26 NOTE — Progress Notes (Signed)
Pre visit review using our clinic review tool, if applicable. No additional management support is needed unless otherwise documented below in the visit note. 

## 2017-11-26 NOTE — Progress Notes (Signed)
Subjective:   Chief Complaint  Patient presents with  . Follow-up    Joe Boyle is a 52 y.o. male here for follow-up of diabetes.   Joe Boyle  Has not been monitoring his sugars Patient does not require insulin.   Medications include: Metformin 1000 mg bid Reports diet is not healthy overall. Patient exercises intermittently.    Hypertension Patient presents for hypertension follow up. He does monitor home blood pressures. Blood pressures ranging on average from 90-120's/60-70's. He is compliant with medication- quinapril 20 mg/d. Patient has these side effects of medication: none He is not adhering to a healthy diet overall. Exercise: golfing   Past Medical History:  Diagnosis Date  . Diabetes mellitus   . Hypertension   . Stroke [facility])    Hx of 3 strokes    Past Surgical History:  Procedure Laterality Date  . NO PAST SURGERIES      Family History  Problem Relation Age of Onset  . Arthritis Mother   . Cancer Mother   . Depression Mother   . Cancer Father    Current Outpatient Medications on File Prior to Visit  Medication Sig Dispense Refill  . aspirin EC 325 MG tablet Take 1 tablet (325 mg total) by mouth daily. 30 tablet 0  . clopidogrel (PLAVIX) 75 MG tablet Take 1 tablet (75 mg total) by mouth daily. 90 tablet 1  . metFORMIN (GLUMETZA) 1000 MG (MOD) 24 hr tablet Take 1,000 mg by mouth 2 (two) times daily with a meal.    . metoprolol succinate (TOPROL-XL) 50 MG 24 hr tablet Take 50 mg by mouth daily.    . quinapril (ACCUPRIL) 20 MG tablet Take 20 mg by mouth daily.  0  . rosuvastatin (CRESTOR) 20 MG tablet Take 20 mg by mouth daily.     Related testing: Date of retinal exam: Due; in process of being scheduled Pneumovax: pt refuses  Review of Systems: Pulmonary:  No SOB Cardiovascular:  No chest pain  Objective:  BP 128/80 (BP Location: Left Arm, Patient Position: Sitting, Cuff Size: Normal)   Pulse 84   Temp (!) 97.5 F (36.4 C) (Oral)   Ht 5\' 8"  (1.727  m)   Wt 157 lb 2 oz (71.3 kg)   SpO2 98%   BMI 23.89 kg/m  General:  Well developed, well nourished, in no apparent distress Skin:  Warm, no pallor or diaphoresis Head:  Normocephalic, atraumatic Eyes:  Pupils equal and round, sclera anicteric without injection  Lungs:  CTAB, no access msc use Cardio:  RRR, no bruits, no LE edema Musculoskeletal:  Symmetrical muscle groups noted without atrophy or deformity Neuro:  Sensation intact to pinprick on feet Psych: Age appropriate judgment and insight  Assessment:   Diabetes 1.5, managed as type 2 ([facility]) - Plan: Hemoglobin A1c, Lipid panel, Comprehensive metabolic panel, CBC, Microalbumin / creatinine urine ratio, HM DIABETES FOOT EXAM  Essential hypertension   Plan:   Orders as above. Counseled on diet and exercise. Stove coming in 1 mo, admits his diet is poor at this time. Let's stop metoprolol. Cont ACEi.  Cont Metformin.  F/u in 3 mo, 6 if well controlled.  The patient voiced understanding and agreement to the plan.  [place], DO 11/26/17 10:51 AM

## 2017-11-26 NOTE — Patient Instructions (Addendum)
Do your best to keep the diet clean in spite of the stove situation.  Stay active.  Give [place] 2-3 business days to get the results of your labs back.  Let us know if you need anything.

## 2017-11-29 ENCOUNTER — Other Ambulatory Visit: Payer: Self-pay | Admitting: Family Medicine

## 2017-11-29 DIAGNOSIS — R7989 Other specified abnormal findings of blood chemistry: Secondary | ICD-10-CM

## 2017-12-03 ENCOUNTER — Other Ambulatory Visit (INDEPENDENT_AMBULATORY_CARE_PROVIDER_SITE_OTHER): Payer: BLUE CROSS/BLUE SHIELD

## 2017-12-03 DIAGNOSIS — R7989 Other specified abnormal findings of blood chemistry: Secondary | ICD-10-CM | POA: Diagnosis not present

## 2017-12-04 LAB — IRON,TIBC AND FERRITIN PANEL
%SAT: 24 % (ref 20–48)
Ferritin: 227 ng/mL (ref 38–380)
Iron: 82 ug/dL (ref 50–180)
TIBC: 342 mcg/dL (calc) (ref 250–425)

## 2017-12-06 LAB — CBC (INCLUDES DIFF/PLT) WITH PATHOLOGIST REVIEW
BASOS ABS: 50 {cells}/uL (ref 0–200)
BASOS PCT: 1 %
EOS ABS: 555 {cells}/uL — AB (ref 15–500)
Eosinophils Relative: 11.1 %
HCT: 32.4 % — ABNORMAL LOW (ref 38.5–50.0)
Hemoglobin: 10.7 g/dL — ABNORMAL LOW (ref 13.2–17.1)
Lymphs Abs: 1380 cells/uL (ref 850–3900)
MCH: 29 pg (ref 27.0–33.0)
MCHC: 33 g/dL (ref 32.0–36.0)
MCV: 87.8 fL (ref 80.0–100.0)
MPV: 10.7 fL (ref 7.5–12.5)
Monocytes Relative: 9.1 %
Neutro Abs: 2560 cells/uL (ref 1500–7800)
Neutrophils Relative %: 51.2 %
Platelets: 201 10*3/uL (ref 140–400)
RBC: 3.69 10*6/uL — ABNORMAL LOW (ref 4.20–5.80)
RDW: 12.3 % (ref 11.0–15.0)
TOTAL LYMPHOCYTE: 27.6 %
WBC: 5 10*3/uL (ref 3.8–10.8)
WBCMIX: 455 {cells}/uL (ref 200–950)

## 2017-12-07 ENCOUNTER — Telehealth: Payer: Self-pay | Admitting: Family Medicine

## 2017-12-07 DIAGNOSIS — Z1211 Encounter for screening for malignant neoplasm of colon: Secondary | ICD-10-CM

## 2017-12-07 NOTE — Telephone Encounter (Signed)
Copied from [place] [id number]. Topic: Referral - Request >> Dec 07, 2017 10:00 AM Margot Ables wrote: Reason for CRM: pt received mychart msg and has not had a colonoscopy before. Please refer. Pt prefers something in [facility]. Please advise once referral is sent.

## 2017-12-07 NOTE — Telephone Encounter (Signed)
I have pended referral to gastro in [place] for patient to have colonoscopy. Please advise.

## 2017-12-08 ENCOUNTER — Encounter: Payer: Self-pay | Admitting: Gastroenterology

## 2017-12-08 NOTE — Telephone Encounter (Signed)
Referral placed. TY.  

## 2017-12-23 ENCOUNTER — Telehealth: Payer: Self-pay

## 2017-12-23 ENCOUNTER — Ambulatory Visit (INDEPENDENT_AMBULATORY_CARE_PROVIDER_SITE_OTHER): Payer: BLUE CROSS/BLUE SHIELD | Admitting: Gastroenterology

## 2017-12-23 ENCOUNTER — Encounter: Payer: Self-pay | Admitting: Gastroenterology

## 2017-12-23 ENCOUNTER — Encounter

## 2017-12-23 VITALS — BP 136/88 | HR 82 | Ht 67.0 in | Wt 156.1 lb

## 2017-12-23 DIAGNOSIS — I639 Cerebral infarction, unspecified: Secondary | ICD-10-CM | POA: Diagnosis not present

## 2017-12-23 DIAGNOSIS — Z1211 Encounter for screening for malignant neoplasm of colon: Secondary | ICD-10-CM

## 2017-12-23 DIAGNOSIS — Z1212 Encounter for screening for malignant neoplasm of rectum: Secondary | ICD-10-CM | POA: Diagnosis not present

## 2017-12-23 DIAGNOSIS — R109 Unspecified abdominal pain: Secondary | ICD-10-CM

## 2017-12-23 MED ORDER — SOD PICOSULFATE-MAG OX-CIT ACD 10-3.5-12 MG-GM -GM/160ML PO SOLN: 1.0000 | ORAL | 0 refills | Status: DC

## 2017-12-23 NOTE — Telephone Encounter (Signed)
[facility] Pre-operative Risk Assessment     Request for surgical clearance:     Endoscopy Procedure  What type of surgery is being performed?     Colonoscopy  When is this surgery scheduled?     01/18/2018 3pm  What type of clearance is required ?   Pharmacy  Are there any medications that need to be held prior to surgery and how long? Plavix, 5days  Practice name and name of physician performing surgery?      [facility] [place]  What is your office phone and fax number?      Phone- [phone]  [phone]  Anesthesia type (None, local, MAC, general) ?       MAC

## 2017-12-23 NOTE — Patient Instructions (Signed)
If you are age 51 or older, your body mass index should be between 23-30. Your Body mass index is 24.45 kg/m. If this is out of the aforementioned range listed, please consider follow up with your Primary Care Provider.  If you are age 87 or younger, your body mass index should be between 19-25. Your Body mass index is 24.45 kg/m. If this is out of the aformentioned range listed, please consider follow up with your Primary Care Provider.    You have been scheduled for a colonoscopy. Please follow written instructions given to you at your visit today.  Please pick up your prep supplies at the pharmacy within the next 1-3 days. If you use inhalers (even only as needed), please bring them with you on the day of your procedure. Your physician has requested that you go to www.startemmi.com and enter the access code given to you at your visit today. This web site gives a general overview about your procedure. However, you should still follow specific instructions given to you by our office regarding your preparation for the procedure.  You will be contacted by our office prior to your procedure for directions on holding your Plavix.  If you do not hear from our office 1 week prior to your scheduled procedure, please call [phone] to discuss.   Decrease your Aspirin 325mg  to 81mg  once daily for five days prior to your procedure. Do no hold your 81mg  of Aspirin on the day of the procedure.  We have sent the following medications to your pharmacy for you to pick up at your convenience: Clenpiq  It was a pleasure to see you today!  Vito Cirigliano, D.O.

## 2017-12-23 NOTE — Progress Notes (Signed)
Chief Complaint: Colon cancer screening   Referring Provider:     Shelda Pal DO    HPI:    Joe Boyle is a 52 y.o. male presenting to the Gastroenterology Clinic for initial CRC screening. No family history of CRC or related malignancies, and patient is without any active GI sxs. Denies any melena, hematochezia, nausea, vomiting, diarrhea, constipation, early satiety, and no fever, chills, night sweats. No previous CRC screening to date.   He has a hx of CVA x3 (2011 was initial and worst, with last CVA approx 2-3 years ago) and on chronic antiplatelet therapy with ASA 325 mg and Plavix 75 mg daily. He has since lost 50# intentionally over the last year or so with dietary mods and increased activity along with improved control of DM and HTN.  BP now well controlled and no longer on antihypertensives.  Recent labs notable for normocytic anemia (hemoglobin/hematocrit 10.7/32.4, MCV 87.8, RDW 12.3) with normal iron indices.  Anemia new from 03/2017 when he had hemoglobin/hematocrit 13.0/36.9 with similar MCV/RDW.  He is otherwise without overt GI blood loss.  Hx of RUQ pain for 8-9 years with an unrevealing evaluation at outside facilities in the past to include multiple imaging studies and eval by General Surgeon Per patient. Sxs have improved over the years and now minimally bothersome occurring 1-2 times/month. Not related to activity, PO intake. No exacerbating or alleviating factors. No associated n/v/f/c.  Pain is nonreproducible.   Past Medical History:  Diagnosis Date  . Diabetes mellitus   . Elevated cholesterol   . Hypertension   . Stroke [facility])    Hx of 3 strokes     Past Surgical History:  Procedure Laterality Date  . NO PAST SURGERIES     Family History  Problem Relation Age of Onset  . Arthritis Mother   . Cancer Mother   . Depression Mother   . Cancer Father    Social History   Tobacco Use  . Smoking status: Former Smoker    Packs/day:  1.00    Last attempt to quit: 05/02/1999    Years since quitting: 18.6  . Smokeless tobacco: Never Used  Substance Use Topics  . Alcohol use: Yes    Comment: daily  . Drug use: No   Current Outpatient Medications  Medication Sig Dispense Refill  . aspirin EC 325 MG tablet Take 1 tablet (325 mg total) by mouth daily. 30 tablet 0  . clopidogrel (PLAVIX) 75 MG tablet Take 1 tablet (75 mg total) by mouth daily. 90 tablet 1  . metFORMIN (GLUMETZA) 1000 MG (MOD) 24 hr tablet Take 1,000 mg by mouth 2 (two) times daily with a meal.    . quinapril (ACCUPRIL) 20 MG tablet Take 10 mg by mouth daily.   0  . rosuvastatin (CRESTOR) 20 MG tablet Take 20 mg by mouth daily.     No current facility-administered medications for this visit.    Allergies  Allergen Reactions  . Penicillins Other (See Comments)    From when younger.  . Tylenol [Acetaminophen] Other (See Comments)    Childhood allergy     Review of Systems: All systems reviewed and negative except where noted in HPI.     Physical Exam:    Wt Readings from Last 3 Encounters:  12/23/17 156 lb 2 oz (70.8 kg)  11/26/17 157 lb 2 oz (71.3 kg)  11/12/17 160 lb 6 oz (72.7  kg)    BP 136/88   Pulse 82   Ht _0  (1.702 m)   Wt 156 lb 2 oz (70.8 kg)   BMI 24.45 kg/m  Constitutional:  Pleasant, in no acute distress. Psychiatric: Normal mood and affect. Behavior is normal. EENT: Pupils normal.  Conjunctivae are normal. No scleral icterus. Neck supple. No cervical LAD. Cardiovascular: Normal rate, regular rhythm. No edema Pulmonary/chest: Effort normal and breath sounds normal. No wheezing, rales or rhonchi. Abdominal: Soft, nondistended, nontender. Bowel sounds active throughout. There are no masses palpable. No hepatomegaly. Neurological: Alert and oriented to person place and time. Skin: Skin is warm and dry. No rashes noted.   ASSESSMENT AND PLAN;   Joe Boyle is a 52 y.o. male presenting to the Gastroenterology Clinic for  initial CRC screening. No family history of CRC or related malignancies, and patient is without any active GI sxs. No previous CRC screening to date. Discussed options for CRC screening, to include optical vs virtual colonoscopy - the risks and benefits and pros and cons of each, as well as discussion of FIT kit testing, Cologuard, etc, and the patient decided to proceed with an optical colonoscopy.   1) CRC screening: - Will schedule date and time for colonoscopy prior to leaving clinic today  - NPO at MN prior to procedure  - Bowel prep ordered with plan for instruction with GI clinical staff - All questions answered  2) systemic antiplatelet therapy: Discussed the risks/benefits of continued antiplatelet therapy versus holding occasions in the perioperative period.  We discussed the most recent ASGE guidelines regarding antiplatelet therapy and colonoscopy and ultimately he would like to proceed with decreasing aspirin to 81 mg x 5 days and holding Plavix x5 days preoperative. will instruct when and how to resume after procedure.  We fully discussed the low but real risk of cardiovascular event such as heart attack, stroke, embolism, thrombosis or ischemia/infarct of other organs off Plavix explained and need to seek urgent help if this occurs. The patient consents to proceed. Will communicate by phone or EMR with patient's prescribing provider to confirm that holding Plavix and reducing aspirin to 81 mg is reasonable in this case  3) history of CVA: See above for plan to adjust antiplatelet therapy in the perioperative period.  4) right upper quadrant pain: Long-standing history of RUQ pain without associated exacerbating or alleviating factors.  The symptoms are now very rare and minimally bothersome.  Discussed further evaluation but he states this is been evaluated extensively in the past and since it is very minimal, he declines any further evaluation at this time.  This is reasonable.  5)  normocytic anemia: Nearly three-point decrease in hemoglobin over the last 8 months with unchanged MCV and normal iron indices.  No overt GI blood loss.  Will evaluate for LGI etiology with a time of colonoscopy as above, but otherwise no further endoscopic evaluation needed for for this at this time.  Will defer further evaluation to PCM.  The indications, risks, and benefits of colonoscopy were explained to the patient in detail. Risks include but are not limited to bleeding, perforation, adverse reaction to medications, and cardiopulmonary compromise. Sequelae include but are not limited to the possibility of surgery, hospitalization, and mortality. The patient verbalized understanding and wished to proceed. All questions answered, referred for scheduling and bowel prep ordered. Further recommendations pending results of the exam.      Lavena Bullion, DO, FACG  12/23/2017, 8:47 AM   Nani Ravens,  [place]*

## 2017-12-24 NOTE — Progress Notes (Signed)
Sent to pt PCP for Cardiac Clearance

## 2017-12-24 NOTE — Telephone Encounter (Signed)
   Primary Cardiologist: None Chart reviewed as part of pre-operative protocol coverage. This patient never seen in our practice. Seems he takes plavix for recurrent CVA. Recommended discussion/Clearance with PCP.   I will route this recommendation to the requesting party via Epic fax function and remove from pre-op pool.  Please call with questions.  [employer], PA 12/24/2017, 9:00 AM

## 2017-12-30 ENCOUNTER — Telehealth: Payer: Self-pay | Admitting: Gastroenterology

## 2017-12-31 NOTE — Telephone Encounter (Signed)
Pt phone going straight to Vm. LMOM for patient to return my phone call.

## 2017-12-31 NOTE — Telephone Encounter (Signed)
Patient called back. I notified patient of Dr. Nani Ravens orders to hold Plavix for 5 days prior to procedure. Patient verbalized understanding of orders.    Message  Received: 1 week ago  Message Contents  Shelda Pal, DO  Herma Mering D, CMA        5 days is what I do also. TY.

## 2018-01-04 ENCOUNTER — Encounter: Payer: Self-pay | Admitting: Gastroenterology

## 2018-01-18 ENCOUNTER — Ambulatory Visit (AMBULATORY_SURGERY_CENTER): Payer: BLUE CROSS/BLUE SHIELD | Admitting: Gastroenterology

## 2018-01-18 ENCOUNTER — Encounter: Payer: Self-pay | Admitting: Gastroenterology

## 2018-01-18 VITALS — BP 113/68 | HR 81 | Temp 99.3°F | Resp 14 | Ht 67.0 in | Wt 156.0 lb

## 2018-01-18 DIAGNOSIS — D128 Benign neoplasm of rectum: Secondary | ICD-10-CM | POA: Diagnosis not present

## 2018-01-18 DIAGNOSIS — D125 Benign neoplasm of sigmoid colon: Secondary | ICD-10-CM

## 2018-01-18 DIAGNOSIS — K6389 Other specified diseases of intestine: Secondary | ICD-10-CM | POA: Diagnosis not present

## 2018-01-18 DIAGNOSIS — K6289 Other specified diseases of anus and rectum: Secondary | ICD-10-CM | POA: Diagnosis not present

## 2018-01-18 DIAGNOSIS — Z1211 Encounter for screening for malignant neoplasm of colon: Secondary | ICD-10-CM | POA: Diagnosis not present

## 2018-01-18 DIAGNOSIS — D129 Benign neoplasm of anus and anal canal: Secondary | ICD-10-CM

## 2018-01-18 MED ORDER — SODIUM CHLORIDE 0.9 % IV SOLN: 500.0000 mL | Freq: Once | INTRAVENOUS | Status: DC

## 2018-01-18 NOTE — Progress Notes (Signed)
To PACU, VSS. Report to [email or web address] 

## 2018-01-18 NOTE — Patient Instructions (Signed)
Discharge instructions given. Handout on polyps. Resume aspirin tomorrow and Plavix tomorrow at prior dose. Resume previous medications. YOU HAD AN ENDOSCOPIC PROCEDURE TODAY AT [facility]:   Refer to the procedure report that was given to you for any specific questions about what was found during the examination.  If the procedure report does not answer your questions, please call your gastroenterologist to clarify.  If you requested that your care partner not be given the details of your procedure findings, then the procedure report has been included in a sealed envelope for you to review at your convenience later.  YOU SHOULD EXPECT: Some feelings of bloating in the abdomen. Passage of more gas than usual.  Walking can help get rid of the air that was put into your GI tract during the procedure and reduce the bloating. If you had a lower endoscopy (such as a colonoscopy or flexible sigmoidoscopy) you may notice spotting of blood in your stool or on the toilet paper. If you underwent a bowel prep for your procedure, you may not have a normal bowel movement for a few days.  Please Note:  You might notice some irritation and congestion in your nose or some drainage.  This is from the oxygen used during your procedure.  There is no need for concern and it should clear up in a day or so.  SYMPTOMS TO REPORT IMMEDIATELY:   Following lower endoscopy (colonoscopy or flexible sigmoidoscopy):  Excessive amounts of blood in the stool  Significant tenderness or worsening of abdominal pains  Swelling of the abdomen that is new, acute  Fever of 100F or higher   For urgent or emergent issues, a gastroenterologist can be reached at any hour by calling [phone].   DIET:  We do recommend a small meal at first, but then you may proceed to your regular diet.  Drink plenty of fluids but you should avoid alcoholic beverages for 24 hours.  ACTIVITY:  You should plan to take it easy for  the rest of today and you should NOT DRIVE or use heavy machinery until tomorrow (because of the sedation medicines used during the test).    FOLLOW UP: Our staff will call the number listed on your records the next business day following your procedure to check on you and address any questions or concerns that you may have regarding the information given to you following your procedure. If we do not reach you, we will leave a message.  However, if you are feeling well and you are not experiencing any problems, there is no need to return our call.  We will assume that you have returned to your regular daily activities without incident.  If any biopsies were taken you will be contacted by phone or by letter within the next 1-3 weeks.  Please call us at [phone] if you have not heard about the biopsies in 3 weeks.    SIGNATURES/CONFIDENTIALITY: You and/or your care partner have signed paperwork which will be entered into your electronic medical record.  These signatures attest to the fact that that the information above on your After Visit Summary has been reviewed and is understood.  Full responsibility of the confidentiality of this discharge information lies with you and/or your care-partner.

## 2018-01-18 NOTE — Progress Notes (Signed)
Called to room to assist during endoscopic procedure.  Patient ID and intended procedure confirmed with present staff. Received instructions for my participation in the procedure from the performing physician.  

## 2018-01-18 NOTE — Op Note (Signed)
[place] Patient Name: Joe Boyle Procedure Date: 01/18/2018 2:39 PM MRN: [id number] Endoscopist: Gerrit Heck , MD Age: 52 Referring MD:  Date of Birth: Dec 18, 1965 Gender: Male Account #: [id number] Procedure:                Colonoscopy Indications:              Screening for colorectal malignant neoplasm, This                            is the patient's first colonoscopy Medicines:                Monitored Anesthesia Care Procedure:                Pre-Anesthesia Assessment:                           - Prior to the procedure, a History and Physical                            was performed, and patient medications and                            allergies were reviewed. The patient's tolerance of                            previous anesthesia was also reviewed. The risks                            and benefits of the procedure and the sedation                            options and risks were discussed with the patient.                            All questions were answered, and informed consent                            was obtained. Prior Anticoagulants: The patient has                            taken Plavix (clopidogrel), last dose was 5 days                            prior to procedure. ASA Grade Assessment: II - A                            patient with mild systemic disease. After reviewing                            the risks and benefits, the patient was deemed in                            satisfactory condition to undergo the procedure.  After obtaining informed consent, the colonoscope                            was passed under direct vision. Throughout the                            procedure, the patient's blood pressure, pulse, and                            oxygen saturations were monitored continuously. The                            Colonoscope was introduced through the anus and                            advanced to the the  cecum, identified by                            appendiceal orifice and ileocecal valve. The                            colonoscopy was performed without difficulty. The                            patient tolerated the procedure well. The quality                            of the bowel preparation was adequate. Scope In: 2:51:24 PM Scope Out: 3:14:20 PM Scope Withdrawal Time: 0 hours 18 minutes 36 seconds  Total Procedure Duration: 0 hours 22 minutes 56 seconds  Findings:                 The perianal and digital rectal examinations were                            normal.                           Three sessile polyps were found in the sigmoid                            colon. The polyps were 1 to 2 mm in size. These                            polyps were removed with a cold biopsy forceps.                            Resection and retrieval were complete. Estimated                            blood loss was minimal.                           Two sessile polyps were found in the rectum. The  polyps were 2 to 4 mm in size. These polyps were                            removed with a cold snare. Resection and retrieval                            were complete. Estimated blood loss was minimal.                           A 6 mm polyp was found in the rectum. The polyp was                            pedunculated. The polyp was removed with a hot                            snare. Resection and retrieval were complete.                            Estimated blood loss: none.                           Anal papilla(e) were hypertrophied.                           Retroflexion in the right colon was performed. Complications:            No immediate complications. Estimated Blood Loss:     Estimated blood loss was minimal. Impression:               - Three 1 to 2 mm polyps in the sigmoid colon,                            removed with a cold biopsy forceps. Resected and                             retrieved.                           - Two 2 to 4 mm polyps in the rectum, removed with                            a cold snare. Resected and retrieved.                           - One 6 mm polyp in the rectum, removed with a hot                            snare. Resected and retrieved.                           - Anal papilla(e) were hypertrophied. Recommendation:           - Patient has a contact number available for  emergencies. The signs and symptoms of potential                            delayed complications were discussed with the                            patient. Return to normal activities tomorrow.                            Written discharge instructions were provided to the                            patient.                           - Resume previous diet today.                           - Resume aspirin tomorrow and Plavix (clopidogrel)                            tomorrow at prior doses.                           - Await pathology results.                           - Repeat colonoscopy in 3 - 5 years for                            surveillance based on pathology results.                           - Return to GI office PRN. Gerrit Heck, MD 01/18/2018 3:20:03 PM

## 2018-01-19 ENCOUNTER — Telehealth: Payer: Self-pay

## 2018-01-19 NOTE — Telephone Encounter (Signed)
  Follow up Call-  Call back number 01/18/2018  Post procedure Call Back phone  # [phone]  Permission to leave phone message Yes  Some recent data might be hidden     Patient questions:  Do you have a fever, pain , or abdominal swelling? No. Pain Score  0 *  Have you tolerated food without any problems? Yes.    Have you been able to return to your normal activities? Yes.    Do you have any questions about your discharge instructions: Diet   No. Medications  No. Follow up visit  No.  Do you have questions or concerns about your Care? No.  Actions: * If pain score is 4 or above: No action needed, pain <4.

## 2018-01-20 ENCOUNTER — Encounter: Payer: Self-pay | Admitting: Family Medicine

## 2018-01-21 ENCOUNTER — Other Ambulatory Visit: Payer: Self-pay | Admitting: Family Medicine

## 2018-01-21 MED ORDER — METFORMIN HCL ER (MOD) 1000 MG PO TB24: 1000.0000 mg | ORAL_TABLET | Freq: Two times a day (BID) | ORAL | 1 refills | Status: DC

## 2018-01-21 MED ORDER — QUINAPRIL HCL 10 MG PO TABS: 10.0000 mg | ORAL_TABLET | Freq: Every day | ORAL | 1 refills | Status: DC

## 2018-01-21 NOTE — Telephone Encounter (Signed)
Refilled

## 2018-01-25 ENCOUNTER — Encounter: Payer: Self-pay | Admitting: Gastroenterology

## 2018-01-25 ENCOUNTER — Other Ambulatory Visit: Payer: Self-pay | Admitting: Family Medicine

## 2018-01-25 NOTE — Telephone Encounter (Signed)
See Pt comments.   Patient Comment: This is unavailable per the pharmacy, they have called the practice 3 times to correct. Please change this to Metformin 750MG  ER. I need the ER or it causes issues. I will follow up and 750 will be plenty twice a day. The 1000 and quinapril doubledose is from a bad PA that quit after less than 1 yr. I got stuck with himbecause my primary retired. I will make the 750mg  ER work. You can test me. Also, I had the colonoscopy so what is next on the list to figure out why my blood count is low?

## 2018-01-26 MED ORDER — METFORMIN HCL ER 750 MG PO TB24: 750.0000 mg | ORAL_TABLET | Freq: Two times a day (BID) | ORAL | 1 refills | Status: DC

## 2018-01-26 NOTE — Telephone Encounter (Signed)
Done

## 2018-02-24 ENCOUNTER — Encounter: Payer: Self-pay | Admitting: Family Medicine

## 2018-02-24 MED ORDER — METFORMIN HCL ER 750 MG PO TB24: 750.0000 mg | ORAL_TABLET | Freq: Two times a day (BID) | ORAL | 3 refills | Status: AC

## 2018-02-24 MED ORDER — QUINAPRIL HCL 10 MG PO TABS: 10.0000 mg | ORAL_TABLET | Freq: Every day | ORAL | 3 refills | Status: AC

## 2018-02-24 MED ORDER — ROSUVASTATIN CALCIUM 20 MG PO TABS: 20.0000 mg | ORAL_TABLET | Freq: Every day | ORAL | 3 refills | Status: AC

## 2018-02-24 MED ORDER — CLOPIDOGREL BISULFATE 75 MG PO TABS: 75.0000 mg | ORAL_TABLET | Freq: Every day | ORAL | 1 refills | Status: DC

## 2018-02-24 NOTE — Addendum Note (Signed)
Addended by: Sharon Seller B on: 02/24/2018 12:49 PM   Modules accepted: Orders

## 2018-11-19 DEATH — deceased

## 2018-12-03 ENCOUNTER — Other Ambulatory Visit: Payer: Self-pay | Admitting: Family Medicine

## 2019-08-10 IMAGING — CT CT HEAD W/O CM
4 series · 16 of 47 positions shown, 18 images · non-contrast
Comparison: MRI and CT scans February 27, 2013

CLINICAL DATA: Left-sided weakness.

EXAM:
CT HEAD WITHOUT CONTRAST
TECHNIQUE: Contiguous axial images were obtained from the base of the skull
through the vertex without intravenous contrast.

[Series 3: head without · axial · non-contrast · 0.44mm/px · z∈[-68,+67]mm · 7 of 37 slices shown, 9 images]
[im 5/37  brain]
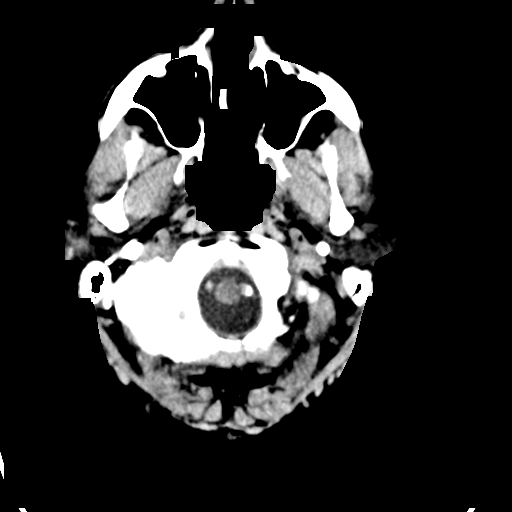
[im 5/37  bone]
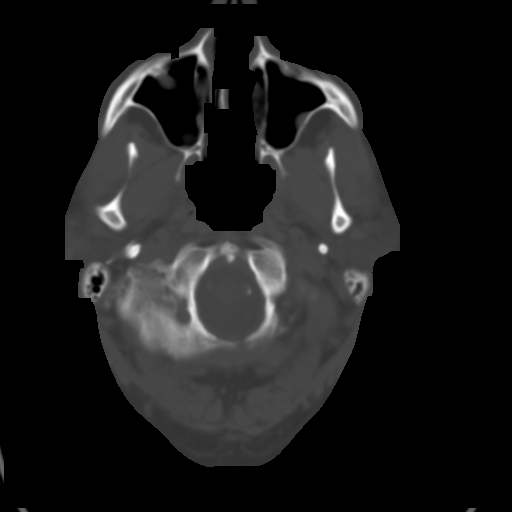
[im 10/37  brain]
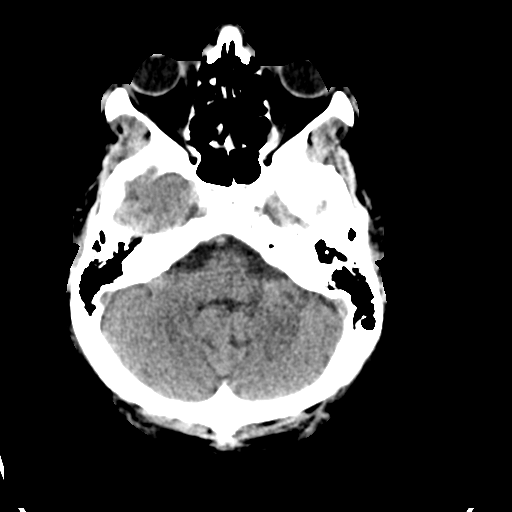
[im 14/37  brain]
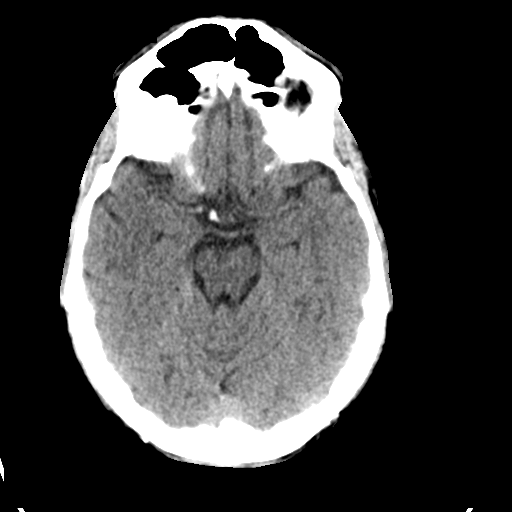
[im 19/37  brain]
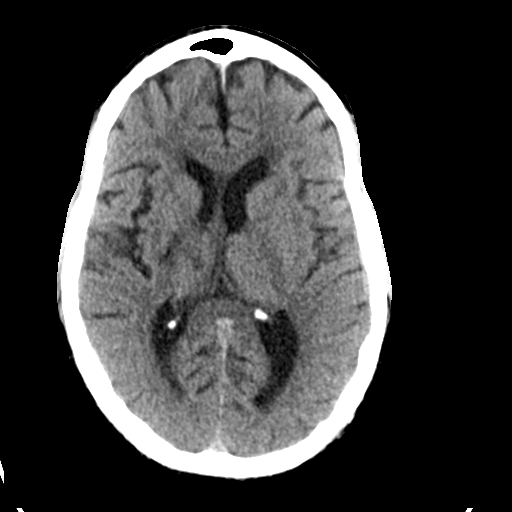
[im 23/37  brain]
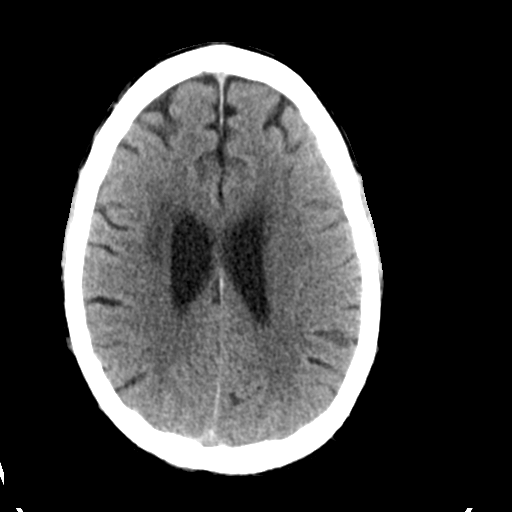
[im 23/37  bone]
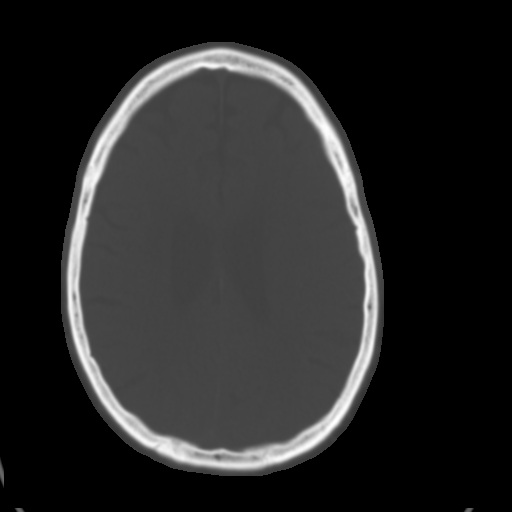
[im 28/37  brain]
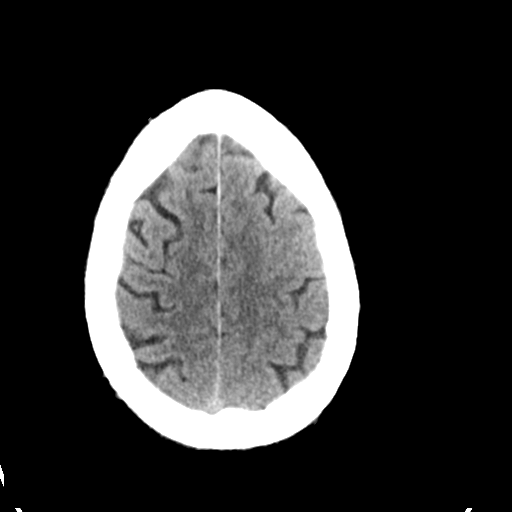
[im 32/37  brain]
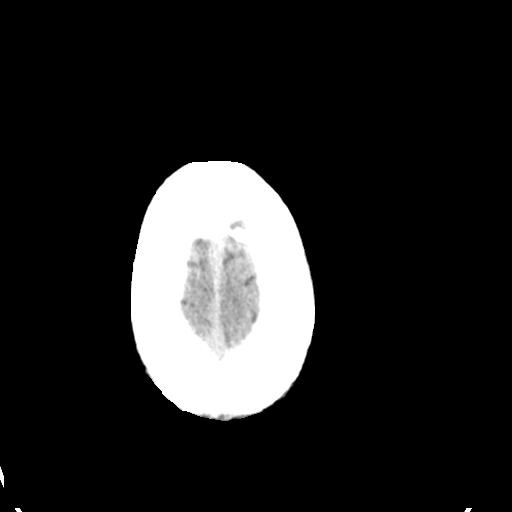

[Series 4: head bone · axial · 0.44mm/px · z∈[-70,-34]mm · 3 of 93 slices shown]
[im 10/93  bone]
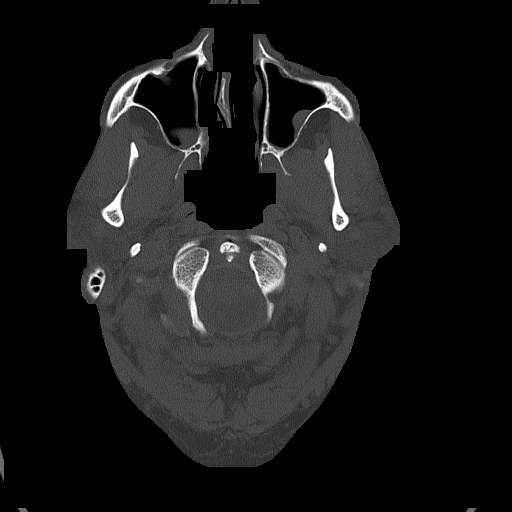
[im 19/93  bone]
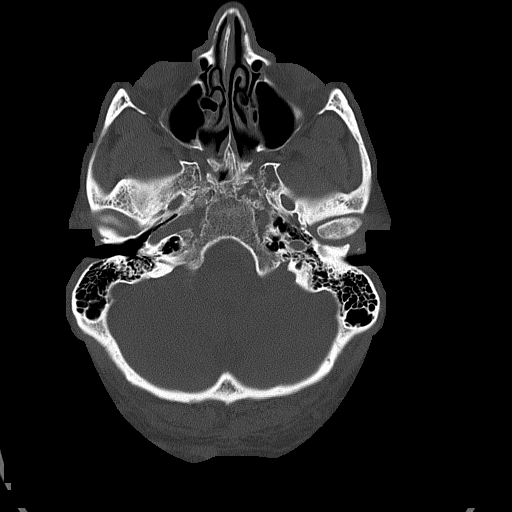
[im 28/93  bone]
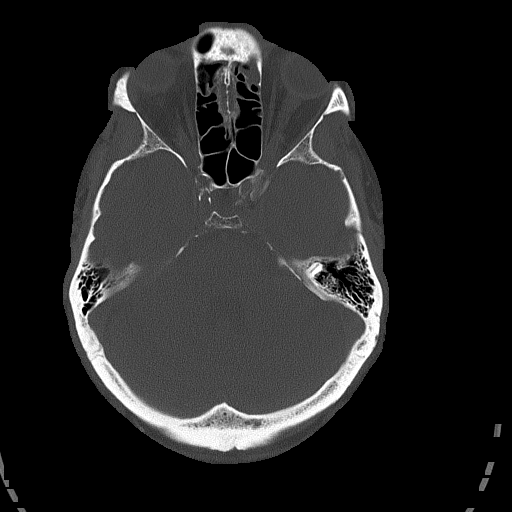

[Series 5: head without cor · coronal · non-contrast · 0.39mm/px · 3 of 79 slices shown]
[im 27/79  brain]
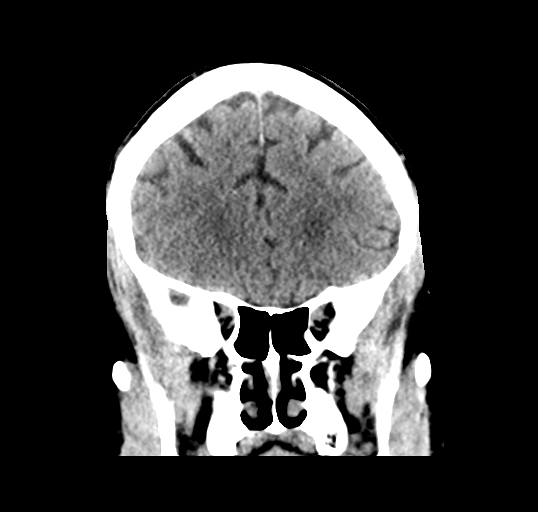
[im 35/79  brain]
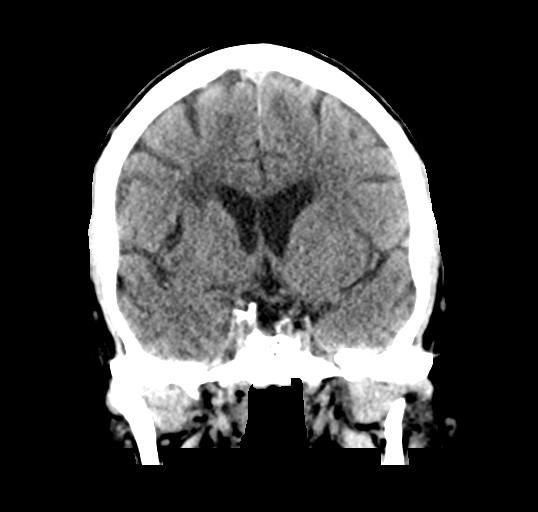
[im 44/79  brain]
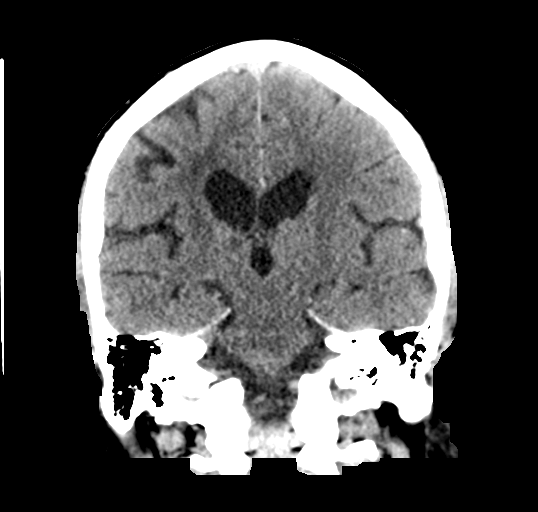

[Series 6: head without sag · sagittal · non-contrast · 0.41mm/px · 3 of 65 slices shown]
[im 22/65  brain]
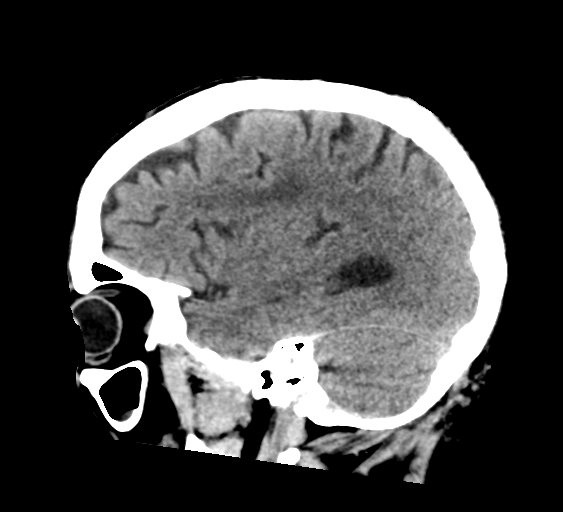
[im 33/65  brain]
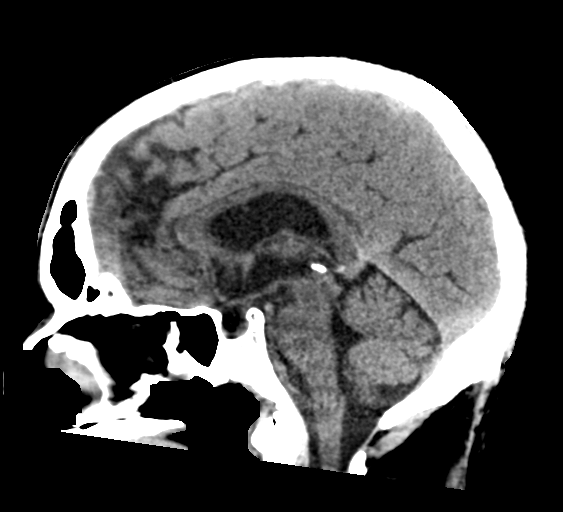
[im 43/65  brain]
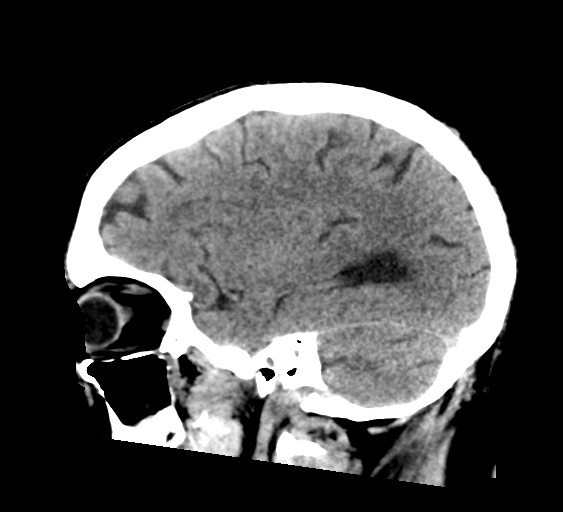

[16 of 47 positions shown; findings below may reference images not displayed]

FINDINGS: Brain: No subdural, epidural, or subarachnoid hemorrhage. A lacunar
infarct in the inferior right basal ganglia on series 3, image 18 is
stable. An age-indeterminate lacunar infarct in the right thalamus
on image 19 was not present in 7724. Lacunar infarcts more
superiorly in the right basal ganglia were noted to be acute on the
February 27, 2013 MRI. There is mild adjacent encephalomalacia.
White matter changes in the right corona radiata have increased. No
other acute cortical ischemia or infarct. Cerebellum, brainstem, and
basal cisterns are normal. Ventricles and sulci are unremarkable. No
mass effect or midline shift.

Vascular: Calcified atherosclerotic changes in the intracranial
carotids.

Skull: Normal. Negative for fracture or focal lesion.

Sinuses/Orbits: Mucosal thickening in the maxillary sinuses and
ethmoid air cells. No air-fluid levels. Mastoid air cells and middle
ears are well aerated.

Other: None.
IMPRESSION: 1. There is an age-indeterminate lacunar infarct in the right
thalamus not present in 7724.
2. Other lacunar infarcts in the right basal ganglia are chronic
since 7724 and there are increasing white matter changes in the
corona radiata which are also most likely chronic.
3. No other acute intracranial abnormalities.
